# Patient Record
Sex: Female | Born: 1965 | Race: White | Hispanic: No | Smoking: Never smoker
Health system: Southern US, Community
[De-identification: ages and names within clinical notes are randomized; demographics above are authoritative.]

---

## 1987-02-18 HISTORY — PX: TONSILLECTOMY: SUR1361

## 1989-02-17 HISTORY — PX: BUNIONECTOMY: SHX129

## 1997-11-29 ENCOUNTER — Other Ambulatory Visit: Admission: RE | Admit: 1997-11-29 | Discharge: 1997-11-29 | Payer: Self-pay | Admitting: Obstetrics and Gynecology

## 1998-02-14 ENCOUNTER — Other Ambulatory Visit: Admission: RE | Admit: 1998-02-14 | Discharge: 1998-02-14 | Payer: Self-pay | Admitting: Obstetrics and Gynecology

## 1998-06-05 ENCOUNTER — Other Ambulatory Visit: Admission: RE | Admit: 1998-06-05 | Discharge: 1998-06-05 | Payer: Self-pay | Admitting: Obstetrics and Gynecology

## 1999-01-18 ENCOUNTER — Other Ambulatory Visit: Admission: RE | Admit: 1999-01-18 | Discharge: 1999-01-18 | Payer: Self-pay | Admitting: Obstetrics and Gynecology

## 1999-08-17 ENCOUNTER — Inpatient Hospital Stay (HOSPITAL_COMMUNITY): Admission: AD | Admit: 1999-08-17 | Discharge: 1999-08-20 | Payer: Self-pay | Admitting: Obstetrics and Gynecology

## 1999-08-17 ENCOUNTER — Encounter: Payer: Self-pay | Admitting: Obstetrics and Gynecology

## 1999-08-21 ENCOUNTER — Encounter: Admission: RE | Admit: 1999-08-21 | Discharge: 1999-09-17 | Payer: Self-pay | Admitting: Obstetrics and Gynecology

## 1999-09-26 ENCOUNTER — Other Ambulatory Visit: Admission: RE | Admit: 1999-09-26 | Discharge: 1999-09-26 | Payer: Self-pay | Admitting: Obstetrics and Gynecology

## 2000-11-11 ENCOUNTER — Other Ambulatory Visit: Admission: RE | Admit: 2000-11-11 | Discharge: 2000-11-11 | Payer: Self-pay | Admitting: Obstetrics and Gynecology

## 2001-12-16 ENCOUNTER — Other Ambulatory Visit: Admission: RE | Admit: 2001-12-16 | Discharge: 2001-12-16 | Payer: Self-pay | Admitting: Obstetrics and Gynecology

## 2003-03-08 ENCOUNTER — Other Ambulatory Visit: Admission: RE | Admit: 2003-03-08 | Discharge: 2003-03-08 | Payer: Self-pay | Admitting: Gynecology

## 2010-03-10 ENCOUNTER — Encounter: Payer: Self-pay | Admitting: Obstetrics and Gynecology

## 2012-06-23 ENCOUNTER — Other Ambulatory Visit: Payer: Self-pay | Admitting: Obstetrics and Gynecology

## 2012-06-23 DIAGNOSIS — N6311 Unspecified lump in the right breast, upper outer quadrant: Secondary | ICD-10-CM

## 2012-07-07 ENCOUNTER — Ambulatory Visit
Admission: RE | Admit: 2012-07-07 | Discharge: 2012-07-07 | Disposition: A | Discharge status: Home / Self Care | Payer: BC Managed Care – PPO | Source: Ambulatory Visit | Attending: Obstetrics and Gynecology | Admitting: Obstetrics and Gynecology

## 2012-07-07 DIAGNOSIS — N6311 Unspecified lump in the right breast, upper outer quadrant: Secondary | ICD-10-CM

## 2013-08-19 DIAGNOSIS — R61 Generalized hyperhidrosis: Secondary | ICD-10-CM | POA: Insufficient documentation

## 2013-08-19 DIAGNOSIS — J069 Acute upper respiratory infection, unspecified: Secondary | ICD-10-CM | POA: Insufficient documentation

## 2013-08-19 DIAGNOSIS — F4010 Social phobia, unspecified: Secondary | ICD-10-CM | POA: Insufficient documentation

## 2014-02-17 HISTORY — PX: BREAST ENHANCEMENT SURGERY: SHX7

## 2014-04-10 DIAGNOSIS — M25551 Pain in right hip: Secondary | ICD-10-CM | POA: Insufficient documentation

## 2015-05-02 DIAGNOSIS — F418 Other specified anxiety disorders: Secondary | ICD-10-CM | POA: Insufficient documentation

## 2015-06-08 ENCOUNTER — Other Ambulatory Visit: Payer: Self-pay | Admitting: Obstetrics and Gynecology

## 2015-06-08 DIAGNOSIS — R928 Other abnormal and inconclusive findings on diagnostic imaging of breast: Secondary | ICD-10-CM

## 2015-06-15 ENCOUNTER — Ambulatory Visit
Admission: RE | Admit: 2015-06-15 | Discharge: 2015-06-15 | Disposition: A | Discharge status: Home / Self Care | Payer: BC Managed Care – PPO | Source: Ambulatory Visit | Attending: Obstetrics and Gynecology | Admitting: Obstetrics and Gynecology

## 2015-06-15 DIAGNOSIS — R928 Other abnormal and inconclusive findings on diagnostic imaging of breast: Secondary | ICD-10-CM

## 2015-11-20 ENCOUNTER — Other Ambulatory Visit: Payer: Self-pay | Admitting: Obstetrics and Gynecology

## 2015-11-20 DIAGNOSIS — R928 Other abnormal and inconclusive findings on diagnostic imaging of breast: Secondary | ICD-10-CM

## 2015-12-04 ENCOUNTER — Other Ambulatory Visit: Payer: BC Managed Care – PPO

## 2015-12-05 ENCOUNTER — Other Ambulatory Visit: Payer: Self-pay | Admitting: Obstetrics and Gynecology

## 2015-12-05 ENCOUNTER — Ambulatory Visit
Admission: RE | Admit: 2015-12-05 | Discharge: 2015-12-05 | Disposition: A | Discharge status: Home / Self Care | Payer: BC Managed Care – PPO | Source: Ambulatory Visit | Attending: Obstetrics and Gynecology | Admitting: Obstetrics and Gynecology

## 2015-12-05 DIAGNOSIS — N63 Unspecified lump in unspecified breast: Secondary | ICD-10-CM

## 2015-12-05 DIAGNOSIS — R928 Other abnormal and inconclusive findings on diagnostic imaging of breast: Secondary | ICD-10-CM

## 2015-12-10 ENCOUNTER — Other Ambulatory Visit: Payer: Self-pay | Admitting: Obstetrics and Gynecology

## 2015-12-10 DIAGNOSIS — N6320 Unspecified lump in the left breast, unspecified quadrant: Secondary | ICD-10-CM

## 2015-12-17 ENCOUNTER — Ambulatory Visit
Admission: RE | Admit: 2015-12-17 | Discharge: 2015-12-17 | Disposition: A | Discharge status: Home / Self Care | Payer: BC Managed Care – PPO | Source: Ambulatory Visit | Attending: Obstetrics and Gynecology | Admitting: Obstetrics and Gynecology

## 2015-12-17 DIAGNOSIS — N6320 Unspecified lump in the left breast, unspecified quadrant: Secondary | ICD-10-CM

## 2017-03-18 ENCOUNTER — Encounter: Payer: Self-pay | Admitting: Internal Medicine

## 2017-05-06 ENCOUNTER — Encounter: Payer: Self-pay | Admitting: Internal Medicine

## 2017-05-06 ENCOUNTER — Ambulatory Visit (AMBULATORY_SURGERY_CENTER): Payer: Self-pay | Admitting: *Deleted

## 2017-05-06 ENCOUNTER — Other Ambulatory Visit: Payer: Self-pay

## 2017-05-06 VITALS — Ht 70.0 in | Wt 176.0 lb

## 2017-05-06 DIAGNOSIS — Z1211 Encounter for screening for malignant neoplasm of colon: Secondary | ICD-10-CM

## 2017-05-06 NOTE — Progress Notes (Signed)
Patient denies any allergies to eggs or soy. Patient denies any problems with anesthesia/sedation. Patient had nausea after breast augmentation sx 2016. Patient denies any oxygen use at home. Patient denies taking any diet/weight loss medications or blood thinners. EMMI education assisgned to patient on colonoscopy, this was explained and instructions given to patient.

## 2017-05-20 ENCOUNTER — Encounter: Payer: Self-pay | Admitting: Internal Medicine

## 2017-05-20 ENCOUNTER — Other Ambulatory Visit: Payer: Self-pay

## 2017-05-20 ENCOUNTER — Ambulatory Visit (AMBULATORY_SURGERY_CENTER): Payer: BC Managed Care – PPO | Admitting: Internal Medicine

## 2017-05-20 VITALS — BP 111/64 | HR 68 | Temp 98.9°F | Resp 15 | Ht 70.0 in | Wt 176.0 lb

## 2017-05-20 DIAGNOSIS — Z1211 Encounter for screening for malignant neoplasm of colon: Secondary | ICD-10-CM | POA: Diagnosis not present

## 2017-05-20 DIAGNOSIS — Z1212 Encounter for screening for malignant neoplasm of rectum: Secondary | ICD-10-CM

## 2017-05-20 MED ORDER — SODIUM CHLORIDE 0.9 % IV SOLN: 500.0000 mL | Freq: Once | INTRAVENOUS | Status: DC

## 2017-05-20 NOTE — Op Note (Signed)
[facility] Patient Name: Myracle Febres Procedure Date: 05/20/2017 12:04 PM MRN: [id number] Endoscopist: Iva Boop , MD Age: 52 Referring MD:  Date of Birth: 12-08-1965 Gender: Female Account #: [id number] Procedure:                Colonoscopy Indications:              Screening for colorectal malignant neoplasm, This                            is the patient's first colonoscopy Medicines:                Propofol per Anesthesia, Monitored Anesthesia Care Procedure:                Pre-Anesthesia Assessment:                           - Prior to the procedure, a History and Physical                            was performed, and patient medications and                            allergies were reviewed. The patient's tolerance of                            previous anesthesia was also reviewed. The risks                            and benefits of the procedure and the sedation                            options and risks were discussed with the patient.                            All questions were answered, and informed consent                            was obtained. Prior Anticoagulants: The patient has                            taken no previous anticoagulant or antiplatelet                            agents. ASA Grade Assessment: I - A normal, healthy                            patient. After reviewing the risks and benefits,                            the patient was deemed in satisfactory condition to                            undergo the procedure.  After obtaining informed consent, the colonoscope                            was passed under direct vision. Throughout the                            procedure, the patient's blood pressure, pulse, and                            oxygen saturations were monitored continuously. The                            Colonoscope was introduced through the anus and                            advanced to  the the cecum, identified by                            appendiceal orifice and ileocecal valve. The                            colonoscopy was technically difficult and complex                            due to a redundant colon. Successful completion of                            the procedure was aided by applying abdominal                            pressure. The patient tolerated the procedure well.                            The quality of the bowel preparation was excellent.                            The ileocecal valve, appendiceal orifice, and                            rectum were photographed. The bowel preparation                            used was Miralax. Scope In: 12:12:18 PM Scope Out: 12:31:41 PM Scope Withdrawal Time: 0 hours 10 minutes 2 seconds  Total Procedure Duration: 0 hours 19 minutes 23 seconds  Findings:                 The perianal and digital rectal examinations were                            normal.                           The entire examined colon appeared normal on direct  and retroflexion views. Complications:            No immediate complications. Estimated Blood Loss:     Estimated blood loss: none. Impression:               - The entire examined colon is normal on direct and                            retroflexion views.                           - No specimens collected. Recommendation:           - Patient has a contact number available for                            emergencies. The signs and symptoms of potential                            delayed complications were discussed with the                            patient. Return to normal activities tomorrow.                            Written discharge instructions were provided to the                            patient.                           - Resume previous diet.                           - Continue present medications.                           - Repeat  colonoscopy or other appropriate test not                            before but in 10 years. Iva Boop, MD 05/20/2017 12:35:48 PM This report has been signed electronically.

## 2017-05-20 NOTE — Patient Instructions (Addendum)
   Your colon is normal - no polyps or cancer seen!  Next routine colonoscopy or other screening test in 10 years - 2029  I appreciate the opportunity to care for you. Iva Booparl E. Devra Stare, MD, FACG YOU HAD AN ENDOSCOPIC PROCEDURE TODAY AT THE [facility]:   Refer to the procedure report that was given to you for any specific questions about what was found during the examination.  If the procedure report does not answer your questions, please call your gastroenterologist to clarify.  If you requested that your care partner not be given the details of your procedure findings, then the procedure report has been included in a sealed envelope for you to review at your convenience later.  YOU SHOULD EXPECT: Some feelings of bloating in the abdomen. Passage of more gas than usual.  Walking can help get rid of the air that was put into your GI tract during the procedure and reduce the bloating. If you had a lower endoscopy (such as a colonoscopy or flexible sigmoidoscopy) you may notice spotting of blood in your stool or on the toilet paper. If you underwent a bowel prep for your procedure, you may not have a normal bowel movement for a few days.  Please Note:  You might notice some irritation and congestion in your nose or some drainage.  This is from the oxygen used during your procedure.  There is no need for concern and it should clear up in a day or so.  SYMPTOMS TO REPORT IMMEDIATELY:   Following lower endoscopy (colonoscopy or flexible sigmoidoscopy):  Excessive amounts of blood in the stool  Significant tenderness or worsening of abdominal pains  Swelling of the abdomen that is new, acute  Fever of 100F or higher  For urgent or emergent issues, a gastroenterologist can be reached at any hour by calling [phone].   DIET:  We do recommend a small meal at first, but then you may proceed to your regular diet.  Drink plenty of fluids but you should avoid alcoholic beverages  for 24 hours.  ACTIVITY:  You should plan to take it easy for the rest of today and you should NOT DRIVE or use heavy machinery until tomorrow (because of the sedation medicines used during the test).    FOLLOW UP: Our staff will call the number listed on your records the next business day following your procedure to check on you and address any questions or concerns that you may have regarding the information given to you following your procedure. If we do not reach you, we will leave a message.  However, if you are feeling well and you are not experiencing any problems, there is no need to return our call.  We will assume that you have returned to your regular daily activities without incident.  If any biopsies were taken you will be contacted by phone or by letter within the next 1-3 weeks.  Please call us at [phone] if you have not heard about the biopsies in 3 weeks.   Repeat Colonoscopy screening in 10 years  SIGNATURES/CONFIDENTIALITY: You and/or your care partner have signed paperwork which will be entered into your electronic medical record.  These signatures attest to the fact that that the information above on your After Visit Summary has been reviewed and is understood.  Full responsibility of the confidentiality of this discharge information lies with you and/or your care-partner.

## 2017-05-20 NOTE — Progress Notes (Signed)
To recovery, report to RN, VSS. 

## 2017-05-21 ENCOUNTER — Telehealth: Payer: Self-pay

## 2017-05-21 NOTE — Telephone Encounter (Signed)
  Follow up Call-  Call back number 05/20/2017  Post procedure Call Back phone  # [phone]  Permission to leave phone message Yes  Some recent data might be hidden     Patient questions:  Do you have a fever, pain , or abdominal swelling? No. Pain Score  0 *  Have you tolerated food without any problems? Yes.    Have you been able to return to your normal activities? Yes.    Do you have any questions about your discharge instructions: Diet   No. Medications  No. Follow up visit  No.  Do you have questions or concerns about your Care? No.  Actions: * If pain score is 4 or above: No action needed, pain <4.

## 2017-09-11 ENCOUNTER — Other Ambulatory Visit: Payer: Self-pay | Admitting: Obstetrics and Gynecology

## 2017-09-11 DIAGNOSIS — N6019 Diffuse cystic mastopathy of unspecified breast: Secondary | ICD-10-CM

## 2017-09-25 ENCOUNTER — Ambulatory Visit
Admission: RE | Admit: 2017-09-25 | Discharge: 2017-09-25 | Disposition: A | Discharge status: Home / Self Care | Payer: BC Managed Care – PPO | Source: Ambulatory Visit | Attending: Obstetrics and Gynecology | Admitting: Obstetrics and Gynecology

## 2017-09-25 DIAGNOSIS — N6019 Diffuse cystic mastopathy of unspecified breast: Secondary | ICD-10-CM

## 2019-01-16 ENCOUNTER — Emergency Department (HOSPITAL_BASED_OUTPATIENT_CLINIC_OR_DEPARTMENT_OTHER): Payer: BC Managed Care – PPO

## 2019-01-16 ENCOUNTER — Encounter (HOSPITAL_BASED_OUTPATIENT_CLINIC_OR_DEPARTMENT_OTHER): Payer: Self-pay | Admitting: Emergency Medicine

## 2019-01-16 ENCOUNTER — Observation Stay (HOSPITAL_BASED_OUTPATIENT_CLINIC_OR_DEPARTMENT_OTHER)
Admission: EM | Admit: 2019-01-16 | Discharge: 2019-01-18 | Disposition: A | Discharge status: Home / Self Care | Payer: BC Managed Care – PPO | Attending: Surgery | Admitting: Surgery

## 2019-01-16 ENCOUNTER — Other Ambulatory Visit: Payer: Self-pay

## 2019-01-16 DIAGNOSIS — Z9882 Breast implant status: Secondary | ICD-10-CM | POA: Diagnosis not present

## 2019-01-16 DIAGNOSIS — Z79899 Other long term (current) drug therapy: Secondary | ICD-10-CM | POA: Insufficient documentation

## 2019-01-16 DIAGNOSIS — Z20828 Contact with and (suspected) exposure to other viral communicable diseases: Secondary | ICD-10-CM | POA: Diagnosis not present

## 2019-01-16 DIAGNOSIS — F419 Anxiety disorder, unspecified: Secondary | ICD-10-CM | POA: Insufficient documentation

## 2019-01-16 DIAGNOSIS — K810 Acute cholecystitis: Secondary | ICD-10-CM | POA: Diagnosis present

## 2019-01-16 DIAGNOSIS — K8010 Calculus of gallbladder with chronic cholecystitis without obstruction: Secondary | ICD-10-CM | POA: Diagnosis present

## 2019-01-16 DIAGNOSIS — Z419 Encounter for procedure for purposes other than remedying health state, unspecified: Secondary | ICD-10-CM

## 2019-01-16 DIAGNOSIS — K8000 Calculus of gallbladder with acute cholecystitis without obstruction: Secondary | ICD-10-CM | POA: Diagnosis not present

## 2019-01-16 DIAGNOSIS — R1011 Right upper quadrant pain: Secondary | ICD-10-CM | POA: Diagnosis present

## 2019-01-16 DIAGNOSIS — K819 Cholecystitis, unspecified: Secondary | ICD-10-CM

## 2019-01-16 LAB — COMPREHENSIVE METABOLIC PANEL
ALT: 14 U/L (ref 0–44)
AST: 19 U/L (ref 15–41)
Albumin: 4.1 g/dL (ref 3.5–5.0)
Alkaline Phosphatase: 47 U/L (ref 38–126)
Anion gap: 10 (ref 5–15)
BUN: 25 mg/dL — ABNORMAL HIGH (ref 6–20)
CO2: 28 mmol/L (ref 22–32)
Calcium: 8.9 mg/dL (ref 8.9–10.3)
Chloride: 101 mmol/L (ref 98–111)
Creatinine, Ser: 0.65 mg/dL (ref 0.44–1.00)
GFR calc Af Amer: >60 mL/min (ref >60)
GFR calc non Af Amer: >60 mL/min (ref >60)
Glucose, Bld: 137 mg/dL — ABNORMAL HIGH (ref 70–99)
Potassium: 3.7 mmol/L (ref 3.5–5.1)
Sodium: 139 mmol/L (ref 135–145)
Total Bilirubin: 0.5 mg/dL (ref 0.3–1.2)
Total Protein: 7.2 g/dL (ref 6.5–8.1)

## 2019-01-16 LAB — LIPASE, BLOOD: Lipase: 29 U/L (ref 11–51)

## 2019-01-16 LAB — CBC WITH DIFFERENTIAL/PLATELET
Abs Immature Granulocytes: 0.02 10*3/uL (ref 0.00–0.07)
Basophils Absolute: 0 10*3/uL (ref 0.0–0.1)
Basophils Relative: 0 %
Eosinophils Absolute: 0 10*3/uL (ref 0.0–0.5)
Eosinophils Relative: 0 %
HCT: 39.5 % (ref 36.0–46.0)
Hemoglobin: 12.8 g/dL (ref 12.0–15.0)
Immature Granulocytes: 0 %
Lymphocytes Relative: 8 %
Lymphs Abs: 0.7 10*3/uL (ref 0.7–4.0)
MCH: 30.1 pg (ref 26.0–34.0)
MCHC: 32.4 g/dL (ref 30.0–36.0)
MCV: 92.9 fL (ref 80.0–100.0)
Monocytes Absolute: 0.2 10*3/uL (ref 0.1–1.0)
Monocytes Relative: 3 %
Neutro Abs: 7.8 10*3/uL — ABNORMAL HIGH (ref 1.7–7.7)
Neutrophils Relative %: 89 %
Platelets: 243 10*3/uL (ref 150–400)
RBC: 4.25 MIL/uL (ref 3.87–5.11)
RDW: 12.4 % (ref 11.5–15.5)
WBC: 8.8 10*3/uL (ref 4.0–10.5)
nRBC: 0 % (ref 0.0–0.2)

## 2019-01-16 LAB — HIV ANTIBODY (ROUTINE TESTING W REFLEX): HIV Screen 4th Generation wRfx: NONREACTIVE

## 2019-01-16 LAB — URINALYSIS, ROUTINE W REFLEX MICROSCOPIC
Bilirubin Urine: NEGATIVE
Glucose, UA: NEGATIVE mg/dL
Hgb urine dipstick: NEGATIVE
Ketones, ur: NEGATIVE mg/dL
Leukocytes,Ua: NEGATIVE
Nitrite: NEGATIVE
Protein, ur: NEGATIVE mg/dL
Specific Gravity, Urine: 1.015 (ref 1.005–1.030)
pH: >9 — ABNORMAL HIGH (ref 5.0–8.0)

## 2019-01-16 LAB — PROTIME-INR
INR: 0.9 (ref 0.8–1.2)
Prothrombin Time: 12.1 seconds (ref 11.4–15.2)

## 2019-01-16 LAB — SARS CORONAVIRUS 2 (TAT 6-24 HRS): SARS Coronavirus 2: NEGATIVE

## 2019-01-16 MED ORDER — ONDANSETRON HCL 4 MG/2ML IJ SOLN
4.0000 mg | Freq: Once | INTRAMUSCULAR | Status: AC
  Administered 2019-01-16: 4 mg via INTRAVENOUS
  Filled 2019-01-16: qty 2

## 2019-01-16 MED ORDER — HYDROMORPHONE HCL 1 MG/ML IJ SOLN
0.5000 mg | INTRAMUSCULAR | Status: DC | PRN
  Administered 2019-01-16: 0.5 mg via INTRAVENOUS
  Filled 2019-01-16: qty 1

## 2019-01-16 MED ORDER — PROMETHAZINE HCL 25 MG/ML IJ SOLN
25.0000 mg | Freq: Once | INTRAMUSCULAR | Status: AC
  Administered 2019-01-16: 25 mg via INTRAVENOUS

## 2019-01-16 MED ORDER — ONDANSETRON 4 MG PO TBDP: 4.0000 mg | ORAL_TABLET | Freq: Four times a day (QID) | ORAL | Status: DC | PRN

## 2019-01-16 MED ORDER — ONDANSETRON 4 MG PO TBDP
ORAL_TABLET | ORAL | Status: AC
  Filled 2019-01-16: qty 1

## 2019-01-16 MED ORDER — ONDANSETRON HCL 4 MG/2ML IJ SOLN
4.0000 mg | Freq: Four times a day (QID) | INTRAMUSCULAR | Status: DC | PRN
  Administered 2019-01-17: 4 mg via INTRAVENOUS
  Filled 2019-01-16: qty 2

## 2019-01-16 MED ORDER — MORPHINE SULFATE (PF) 4 MG/ML IV SOLN
4.0000 mg | Freq: Once | INTRAVENOUS | Status: AC
  Administered 2019-01-16: 4 mg via INTRAVENOUS
  Filled 2019-01-16: qty 1

## 2019-01-16 MED ORDER — MORPHINE SULFATE (PF) 4 MG/ML IV SOLN
4.0000 mg | Freq: Once | INTRAVENOUS | Status: AC
  Administered 2019-01-16: 09:00:00 4 mg via INTRAVENOUS
  Filled 2019-01-16: qty 1

## 2019-01-16 MED ORDER — PANTOPRAZOLE SODIUM 40 MG IV SOLR
40.0000 mg | Freq: Every day | INTRAVENOUS | Status: DC
  Administered 2019-01-16 – 2019-01-17 (×2): 40 mg via INTRAVENOUS
  Filled 2019-01-16 (×2): qty 40

## 2019-01-16 MED ORDER — SODIUM CHLORIDE 0.9 % IV SOLN
2.0000 g | INTRAVENOUS | Status: DC
  Administered 2019-01-16 – 2019-01-17 (×2): 2 g via INTRAVENOUS
  Filled 2019-01-16: qty 2
  Filled 2019-01-16 (×2): qty 20

## 2019-01-16 MED ORDER — PROMETHAZINE HCL 25 MG/ML IJ SOLN
INTRAMUSCULAR | Status: AC
  Filled 2019-01-16: qty 1

## 2019-01-16 MED ORDER — SODIUM CHLORIDE 0.9 % IV SOLN
Freq: Once | INTRAVENOUS | Status: AC
  Administered 2019-01-16: 12:00:00 via INTRAVENOUS

## 2019-01-16 MED ORDER — MORPHINE SULFATE (PF) 2 MG/ML IV SOLN
1.0000 mg | INTRAVENOUS | Status: DC | PRN
  Administered 2019-01-16 – 2019-01-17 (×3): 1 mg via INTRAVENOUS
  Administered 2019-01-17 (×2): 2 mg via INTRAVENOUS
  Filled 2019-01-16 (×5): qty 1

## 2019-01-16 MED ORDER — KCL IN DEXTROSE-NACL 20-5-0.9 MEQ/L-%-% IV SOLN
INTRAVENOUS | Status: DC
  Administered 2019-01-16: 17:00:00 via INTRAVENOUS
  Filled 2019-01-16 (×3): qty 1000

## 2019-01-16 MED ORDER — PIPERACILLIN-TAZOBACTAM 3.375 G IVPB 30 MIN
3.3750 g | Freq: Once | INTRAVENOUS | Status: AC
  Administered 2019-01-16: 3.375 g via INTRAVENOUS
  Filled 2019-01-16 (×2): qty 50

## 2019-01-16 MED ORDER — IOHEXOL 300 MG/ML  SOLN
100.0000 mL | Freq: Once | INTRAMUSCULAR | Status: AC | PRN
  Administered 2019-01-16: 10:00:00 100 mL via INTRAVENOUS

## 2019-01-16 MED ORDER — SODIUM CHLORIDE 0.9 % IV BOLUS
1000.0000 mL | Freq: Once | INTRAVENOUS | Status: AC
  Administered 2019-01-16: 09:00:00 1000 mL via INTRAVENOUS

## 2019-01-16 NOTE — Progress Notes (Signed)
Pt received in stable condition from [place] via [place] to room 1532.  Dr. Marlou Starks paged and made aware. RN will monitor.

## 2019-01-16 NOTE — Plan of Care (Signed)

## 2019-01-16 NOTE — ED Notes (Signed)
Report given to [facility] 

## 2019-01-16 NOTE — ED Provider Notes (Addendum)
[facility] EMERGENCY DEPARTMENT Provider Note   CSN: [id number] Arrival date & time: 01/16/19  0827     History   Chief Complaint Chief Complaint  Patient presents with  . Abdominal Pain  . Nausea  . Diarrhea    HPI Charlotte Hudson is a 53 y.o. female.  She is complaining of acute onset of epigastric abdominal pain nausea vomiting and diarrhea that occurred around 2 AM this morning.  No blood from above or below.  No fevers but has been feeling hot and cold.  No cough or shortness of breath.  No urinary symptoms.  No sick contacts or recent travel.     The history is provided by the patient.  Abdominal Pain Pain location:  Epigastric Pain quality: cramping   Pain radiates to:  Does not radiate Pain severity:  Severe Onset quality:  Sudden Duration:  7 hours Timing:  Constant Progression:  Waxing and waning Chronicity:  New Context: awakening from sleep   Context: not recent illness, not recent travel and not trauma   Relieved by:  Nothing Worsened by:  Nothing Ineffective treatments:  OTC medications Associated symptoms: diarrhea, nausea and vomiting   Associated symptoms: no chest pain, no constipation, no cough, no dysuria, no fever, no hematemesis, no hematochezia, no hematuria, no melena, no shortness of breath, no sore throat and no vaginal bleeding     History reviewed. No pertinent past medical history.  Patient Active Problem List   Diagnosis Date Noted  . Performance anxiety 05/02/2015  . Right hip pain 04/10/2014  . Acute upper respiratory infection 08/19/2013  . Excessive sweating 08/19/2013  . Social phobia 08/19/2013    Past Surgical History:  Procedure Laterality Date  . BREAST ENHANCEMENT SURGERY  2016  . BUNIONECTOMY  1991  . TONSILLECTOMY  1989     OB History   No obstetric history on file.      Home Medications    Prior to Admission medications   Medication Sig Start Date End Date Taking? Authorizing Provider  Norethin  Ace-Eth Estrad-FE (TAYTULLA) 1-20 MG-MCG(24) CAPS Take 1 tablet by mouth daily. 01/07/17   [provider]    Family History Family History  Problem Relation Age of Onset  . Heart failure Mother   . Colon cancer Neg Hx   . Esophageal cancer Neg Hx   . Rectal cancer Neg Hx   . Stomach cancer Neg Hx     Social History Social History   Tobacco Use  . Smoking status: Never Smoker  . Smokeless tobacco: Never Used  Substance Use Topics  . Alcohol use: Yes    Alcohol/week: 2.0 standard drinks    Types: 2 Glasses of wine per week  . Drug use: No     Allergies   Sulfa antibiotics   Review of Systems Review of Systems  Constitutional: Negative for fever.  HENT: Negative for sore throat.   Eyes: Negative for visual disturbance.  Respiratory: Negative for cough and shortness of breath.   Cardiovascular: Negative for chest pain.  Gastrointestinal: Positive for diarrhea, nausea and vomiting. Negative for abdominal pain, constipation, hematemesis, hematochezia and melena.  Genitourinary: Negative for dysuria, hematuria and vaginal bleeding.  Musculoskeletal: Negative for back pain.  Skin: Negative for rash.  Neurological: Negative for headaches.     Physical Exam Updated Vital Signs BP (!) 111/51   Pulse 63   Temp 97.7 F (36.5 C) (Oral)   Resp 18   SpO2 100%   Physical Exam  Vitals signs and nursing note reviewed.  Constitutional:      General: She is not in acute distress.    Appearance: She is well-developed.  HENT:     Head: Normocephalic and atraumatic.  Eyes:     Conjunctiva/sclera: Conjunctivae normal.  Neck:     Musculoskeletal: Neck supple.  Cardiovascular:     Rate and Rhythm: Normal rate and regular rhythm.     Pulses: Normal pulses.     Heart sounds: No murmur.  Pulmonary:     Effort: Pulmonary effort is normal. No respiratory distress.     Breath sounds: Normal breath sounds.  Abdominal:     General: Abdomen is flat.     Palpations:  Abdomen is soft.     Tenderness: There is generalized abdominal tenderness and tenderness in the right upper quadrant, epigastric area, periumbilical area and left upper quadrant. There is no guarding or rebound.  Musculoskeletal: Normal range of motion.     Right lower leg: No edema.     Left lower leg: No edema.  Skin:    General: Skin is warm and dry.     Capillary Refill: Capillary refill takes less than 2 seconds.  Neurological:     General: No focal deficit present.     Mental Status: She is alert.      ED Treatments / Results  Labs (all labs ordered are listed, but only abnormal results are displayed) Labs Reviewed  CBC WITH DIFFERENTIAL/PLATELET - Abnormal; Notable for the following components:      Result Value   Neutro Abs 7.8 (*)    All other components within normal limits  COMPREHENSIVE METABOLIC PANEL - Abnormal; Notable for the following components:   Glucose, Bld 137 (*)    BUN 25 (*)    All other components within normal limits  URINALYSIS, ROUTINE W REFLEX MICROSCOPIC - Abnormal; Notable for the following components:   APPearance CLOUDY (*)    pH >9.0 (*)    All other components within normal limits  SARS CORONAVIRUS 2 (TAT 6-24 HRS)  LIPASE, BLOOD  PROTIME-INR  HIV ANTIBODY (ROUTINE TESTING W REFLEX)  COMPREHENSIVE METABOLIC PANEL  CBC    EKG None  Radiology Ct Abdomen Pelvis W Contrast  Result Date: 01/16/2019 CLINICAL DATA:  Abdominal pain with nausea and vomiting EXAM: CT ABDOMEN AND PELVIS WITH CONTRAST TECHNIQUE: Multidetector CT imaging of the abdomen and pelvis was performed using the standard protocol following bolus administration of intravenous contrast. CONTRAST:  100mL OMNIPAQUE IOHEXOL 300 MG/ML  SOLN COMPARISON:  None. FINDINGS: Lower chest: Lung bases are clear. Breast implants present bilaterally. Hepatobiliary: No focal liver lesions are appreciable. There is apparent sludge in the gallbladder with questionable tiny gallstones.  Gallbladder wall appears mildly thickened with questionable mild pericholecystic fluid. There is slight intrahepatic biliary duct dilatation. There is no common bile duct dilatation. No biliary duct mass or calculus evident. Pancreas: There is no pancreatic mass or inflammatory focus. Spleen: No splenic lesions are evident. Tiny accessory spleen noted inferior to the spleen. Adrenals/Urinary Tract: Adrenals bilaterally appear normal. There is a 5 mm probable cyst in the lower pole left kidney. There is no evident hydronephrosis on either side. There is no evident renal or ureteral calculus on either side. Urinary bladder is midline with wall thickness within normal limits. Stomach/Bowel: There is no appreciable bowel wall or mesenteric thickening. There is moderate stool in the right colon. There is no appreciable bowel obstruction. Terminal ileum appears unremarkable. There is no  evident free air or portal venous air. Vascular/Lymphatic: There is no abdominal aortic aneurysm. No vascular lesions are evident on this study. There is no appreciable adenopathy in the abdomen or pelvis. Reproductive: The uterus is anteverted.  No evident pelvic mass. Other: Appendix appears normal. No abscess or ascites is evident in the abdomen or pelvis. Musculoskeletal: There is degenerative change at L5-S1 with vacuum phenomenon at this level. No blastic or lytic bone lesions are evident. No intramuscular or abdominal wall lesions are evident. IMPRESSION: 1. There is apparent thickening of the gallbladder wall with questionable pericholecystic fluid. There is sludge with questionable superimposed gallstones in the gallbladder. Concern for a degree of cholecystitis; advise correlation with gallbladder ultrasound. 2. Slight intrahepatic biliary duct dilatation. No common bile duct dilatation. 3. No bowel obstruction. No abscess in the abdomen or pelvis. Appendix appears normal. 4. No evident renal or ureteral calculus. No  hydronephrosis. Urinary bladder wall thickness is within normal limits. Electronically Signed   By: Lowella Grip III M.D.   On: 01/16/2019 10:33   US Abdomen Limited Ruq  Result Date: 01/16/2019 CLINICAL DATA:  Epigastric pain. Nausea, vomiting, diarrhea today. Abnormal CT. EXAM: ULTRASOUND ABDOMEN LIMITED RIGHT UPPER QUADRANT COMPARISON:  None. FINDINGS: Gallbladder: Multiple gallstones, largest measuring approximately 2 cm greatest dimension. Additional gallbladder sludge. Gallbladder wall thickening with a demonstrated measurement of 5 mm. Sonographer describes a probable obstructing stone at the gallbladder neck, but difficult to demonstrate. Positive sonographic Murphy's sign was elicited during the exam. Common bile duct: Diameter: 3 mm Liver: No focal lesion identified. Within normal limits in parenchymal echogenicity. Portal vein is patent on color Doppler imaging with normal direction of blood flow towards the liver. Other: None. IMPRESSION: 1. Findings are consistent with acute cholecystitis. Multiple gallstones, with a probable obstructing stone at the gallbladder neck. 2. No bile duct dilatation. 3. Liver appears normal. Electronically Signed   By: Franki Cabot M.D.   On: 01/16/2019 11:13    Procedures Procedures (including critical care time)  Medications Ordered in ED Medications  dextrose 5 % and 0.9 % NaCl with KCl 20 mEq/L infusion (has no administration in time range)  cefTRIAXone (ROCEPHIN) 2 g in sodium chloride 0.9 % 100 mL IVPB (2 g Intravenous New Bag/Given 01/16/19 1555)  morphine 2 MG/ML injection 1-4 mg (1 mg Intravenous Given 01/16/19 1551)  ondansetron (ZOFRAN-ODT) disintegrating tablet 4 mg (has no administration in time range)    Or  ondansetron (ZOFRAN) injection 4 mg (has no administration in time range)  pantoprazole (PROTONIX) injection 40 mg (has no administration in time range)  morphine 4 MG/ML injection 4 mg (4 mg Intravenous Given 01/16/19 0854)   ondansetron (ZOFRAN) injection 4 mg (4 mg Intravenous Given 01/16/19 0852)  sodium chloride 0.9 % bolus 1,000 mL (0 mLs Intravenous Stopped 01/16/19 1043)  iohexol (OMNIPAQUE) 300 MG/ML solution 100 mL (100 mLs Intravenous Contrast Given 01/16/19 1009)  morphine 4 MG/ML injection 4 mg (4 mg Intravenous Given 01/16/19 1040)  promethazine (PHENERGAN) injection 25 mg (25 mg Intravenous Given 01/16/19 1040)  piperacillin-tazobactam (ZOSYN) IVPB 3.375 g (0 g Intravenous Stopped 01/16/19 1218)  0.9 %  sodium chloride infusion ( Intravenous New Bag/Given 01/16/19 1225)     Initial Impression / Assessment and Plan / ED Course  I have reviewed the triage vital signs and the nursing notes.  Pertinent labs & imaging results that were available during my care of the patient were reviewed by me and considered in my medical decision making (see  chart for details).  Clinical Course as of Jan 15 1714  Wynelle Link Jan 16, 2019  [id number] 53 year old female with acute onset of nausea vomiting diarrhea and diffuse upper abdominal pain.  Differential includes obstruction, cholelithiasis, cholecystitis, a GE, peptic ulcer disease, gastritis diverticulitis.    [MB]  1027 Patient CT interpreted by me as gallbladder wall thickening pericholecystic fluid and gallstones.  Awaiting radiology reading.   [MB]  1117 Right upper quadrant ultrasound shows probable acute cholecystitis.  We will put a consult into general surgery.   [MB]  1133 Discussed with Dr. Carolynne Edouard from general surgery.  He is recommending patient be direct admitted to [facility] long where he will evaluate patient.  I updated the patient with this and she is comfortable with plan.   [MB]    Clinical Course User Index [MB] Terrilee Files, MD        Final Clinical Impressions(s) / ED Diagnoses   Final diagnoses:  RUQ abdominal pain  Cholecystitis    ED Discharge Orders    None       Terrilee Files, MD 01/16/19 1716    Terrilee Files, MD  01/16/19 [phone]

## 2019-01-16 NOTE — H&P (Signed)
Charlotte Hudson is an 53 y.o. female.   Chief Complaint: abd pain HPI:  The patient is a 53 year old white female who presents with RUQ abd pain that started about 2 am. It was associated with nausea and vomiting and diarrhea. Denies fever or chills. She had a similar episode a couple months ago that went away quickly. She came to ER where CT and u/s show stones gallbladder and thickening of gb wall. lft's normal  History reviewed. No pertinent past medical history.  Past Surgical History:  Procedure Laterality Date  . BREAST ENHANCEMENT SURGERY  2016  . BUNIONECTOMY  1991  . TONSILLECTOMY  1989    Family History  Problem Relation Age of Onset  . Heart failure Mother   . Colon cancer Neg Hx   . Esophageal cancer Neg Hx   . Rectal cancer Neg Hx   . Stomach cancer Neg Hx    Social History:  reports that she has never smoked. She has never used smokeless tobacco. She reports current alcohol use of about 2.0 standard drinks of alcohol per week. She reports that she does not use drugs.  Allergies:  Allergies  Allergen Reactions  . Sulfa Antibiotics Nausea And Vomiting and Swelling    Facility-Administered Medications Prior to Admission  Medication Dose Route Frequency Provider Last Rate Last Dose  . 0.9 %  sodium chloride infusion  500 mL Intravenous Once Iva Boop, MD       Medications Prior to Admission  Medication Sig Dispense Refill  . Norethin Ace-Eth Estrad-FE (TAYTULLA) 1-20 MG-MCG(24) CAPS Take 1 tablet by mouth daily.      Results for orders placed or performed during the hospital encounter of 01/16/19 (from the past 48 hour(s))  Urinalysis, Routine w reflex microscopic     Status: Abnormal   Collection Time: 01/16/19  8:50 AM  Result Value Ref Range   Color, Urine YELLOW YELLOW   APPearance CLOUDY (A) CLEAR   Specific Gravity, Urine 1.015 1.005 - 1.030   pH >9.0 (H) 5.0 - 8.0   Glucose, UA NEGATIVE NEGATIVE mg/dL   Hgb urine dipstick NEGATIVE NEGATIVE   Bilirubin Urine NEGATIVE NEGATIVE   Ketones, ur NEGATIVE NEGATIVE mg/dL   Protein, ur NEGATIVE NEGATIVE mg/dL   Nitrite NEGATIVE NEGATIVE   Leukocytes,Ua NEGATIVE NEGATIVE    Comment: Microscopic not done on urines with negative protein, blood, leukocytes, nitrite, or glucose < 500 mg/dL. Performed at [facility], [address] [place], [place] [address]   CBC with Differential     Status: Abnormal   Collection Time: 01/16/19  8:51 AM  Result Value Ref Range   WBC 8.8 4.0 - 10.5 K/uL   RBC 4.25 3.87 - 5.11 MIL/uL   Hemoglobin 12.8 12.0 - 15.0 g/dL   HCT 40.9 81.1 - 91.4 %   MCV 92.9 80.0 - 100.0 fL   MCH 30.1 26.0 - 34.0 pg   MCHC 32.4 30.0 - 36.0 g/dL   RDW 78.2 95.6 - 21.3 %   Platelets 243 150 - 400 K/uL   nRBC 0.0 0.0 - 0.2 %   Neutrophils Relative % 89 %   Neutro Abs 7.8 (H) 1.7 - 7.7 K/uL   Lymphocytes Relative 8 %   Lymphs Abs 0.7 0.7 - 4.0 K/uL   Monocytes Relative 3 %   Monocytes Absolute 0.2 0.1 - 1.0 K/uL   Eosinophils Relative 0 %   Eosinophils Absolute 0.0 0.0 - 0.5 K/uL   Basophils  Relative 0 %   Basophils Absolute 0.0 0.0 - 0.1 K/uL   Immature Granulocytes 0 %   Abs Immature Granulocytes 0.02 0.00 - 0.07 K/uL    Comment: Performed at [facility] [place], [address] [place], [place] [address]  Comprehensive metabolic panel     Status: Abnormal   Collection Time: 01/16/19  8:51 AM  Result Value Ref Range   Sodium 139 135 - 145 mmol/L   Potassium 3.7 3.5 - 5.1 mmol/L   Chloride 101 98 - 111 mmol/L   CO2 28 22 - 32 mmol/L   Glucose, Bld 137 (H) 70 - 99 mg/dL   BUN 25 (H) 6 - 20 mg/dL   Creatinine, Ser 6.040.65 0.44 - 1.00 mg/dL   Calcium 8.9 8.9 - 54.010.3 mg/dL   Total Protein 7.2 6.5 - 8.1 g/dL   Albumin 4.1 3.5 - 5.0 g/dL   AST 19 15 - 41 U/L   ALT 14 0 - 44 U/L   Alkaline Phosphatase 47 38 - 126 U/L   Total Bilirubin 0.5 0.3 - 1.2 mg/dL   GFR calc non Af Amer >60 >60 mL/min   GFR calc Af Amer >60 >60 mL/min   Anion gap 10 5 - 15     Comment: Performed at [facility] [place], [address] [place], [place] [address]  Lipase, blood     Status: None   Collection Time: 01/16/19  8:51 AM  Result Value Ref Range   Lipase 29 11 - 51 U/L    Comment: Performed at [facility] [place], [address] [place], [place] [address]  Protime-INR     Status: None   Collection Time: 01/16/19  8:51 AM  Result Value Ref Range   Prothrombin Time 12.1 11.4 - 15.2 seconds   INR 0.9 0.8 - 1.2    Comment: (NOTE) INR goal varies based on device and disease states. Performed at [facility] [place], [address] [place], [place] [address]    Ct Abdomen Pelvis W Contrast  Result Date: 01/16/2019 CLINICAL DATA:  Abdominal pain with nausea and vomiting EXAM: CT ABDOMEN AND PELVIS WITH CONTRAST TECHNIQUE: Multidetector CT imaging of the abdomen and pelvis was performed using the standard protocol following bolus administration of intravenous contrast. CONTRAST:  100mL OMNIPAQUE IOHEXOL 300 MG/ML  SOLN COMPARISON:  None. FINDINGS: Lower chest: Lung bases are clear. Breast implants present bilaterally. Hepatobiliary: No focal liver lesions are appreciable. There is apparent sludge in the gallbladder with questionable tiny gallstones. Gallbladder wall appears mildly thickened with questionable mild pericholecystic fluid. There is slight intrahepatic biliary duct dilatation. There is no common bile duct dilatation. No biliary duct mass or calculus evident. Pancreas: There is no pancreatic mass or inflammatory focus. Spleen: No splenic lesions are evident. Tiny accessory spleen noted inferior to the spleen. Adrenals/Urinary Tract: Adrenals bilaterally appear normal. There is a 5 mm probable cyst in the lower pole left kidney. There is no evident hydronephrosis on either side. There is no evident renal or ureteral calculus on either side. Urinary bladder is midline with wall thickness within normal limits. Stomach/Bowel: There is no appreciable  bowel wall or mesenteric thickening. There is moderate stool in the right colon. There is no appreciable bowel obstruction. Terminal ileum appears unremarkable. There is no evident free air or portal venous air. Vascular/Lymphatic: There is no abdominal aortic aneurysm. No vascular lesions are evident on this study. There is no appreciable adenopathy in the  abdomen or pelvis. Reproductive: The uterus is anteverted.  No evident pelvic mass. Other: Appendix appears normal. No abscess or ascites is evident in the abdomen or pelvis. Musculoskeletal: There is degenerative change at L5-S1 with vacuum phenomenon at this level. No blastic or lytic bone lesions are evident. No intramuscular or abdominal wall lesions are evident. IMPRESSION: 1. There is apparent thickening of the gallbladder wall with questionable pericholecystic fluid. There is sludge with questionable superimposed gallstones in the gallbladder. Concern for a degree of cholecystitis; advise correlation with gallbladder ultrasound. 2. Slight intrahepatic biliary duct dilatation. No common bile duct dilatation. 3. No bowel obstruction. No abscess in the abdomen or pelvis. Appendix appears normal. 4. No evident renal or ureteral calculus. No hydronephrosis. Urinary bladder wall thickness is within normal limits. Electronically Signed   By: Bretta Bang III M.D.   On: 01/16/2019 10:33   US Abdomen Limited Ruq  Result Date: 01/16/2019 CLINICAL DATA:  Epigastric pain. Nausea, vomiting, diarrhea today. Abnormal CT. EXAM: ULTRASOUND ABDOMEN LIMITED RIGHT UPPER QUADRANT COMPARISON:  None. FINDINGS: Gallbladder: Multiple gallstones, largest measuring approximately 2 cm greatest dimension. Additional gallbladder sludge. Gallbladder wall thickening with a demonstrated measurement of 5 mm. Sonographer describes a probable obstructing stone at the gallbladder neck, but difficult to demonstrate. Positive sonographic Murphy's sign was elicited during the exam.  Common bile duct: Diameter: 3 mm Liver: No focal lesion identified. Within normal limits in parenchymal echogenicity. Portal vein is patent on color Doppler imaging with normal direction of blood flow towards the liver. Other: None. IMPRESSION: 1. Findings are consistent with acute cholecystitis. Multiple gallstones, with a probable obstructing stone at the gallbladder neck. 2. No bile duct dilatation. 3. Liver appears normal. Electronically Signed   By: Bary Richard M.D.   On: 01/16/2019 11:13    Review of Systems  Constitutional: Negative.   HENT: Negative.   Eyes: Negative.   Respiratory: Negative.   Cardiovascular: Negative.   Gastrointestinal: Positive for abdominal pain, diarrhea, nausea and vomiting.  Genitourinary: Negative.   Musculoskeletal: Negative.   Skin: Negative.   Neurological: Negative.   Endo/Heme/Allergies: Negative.   Psychiatric/Behavioral: Negative.     Blood pressure 115/74, pulse 65, temperature 98.4 F (36.9 C), temperature source Oral, resp. rate 14, height 5\' 10"  (1.778 m), weight 80.7 kg, last menstrual period 01/18/2018, SpO2 100 %. Physical Exam  Constitutional: She is oriented to person, place, and time. She appears well-developed and well-nourished. No distress.  HENT:  Head: Normocephalic and atraumatic.  Mouth/Throat: No oropharyngeal exudate.  Eyes: Pupils are equal, round, and reactive to light. Conjunctivae and EOM are normal.  Neck: Normal range of motion. Neck supple.  Cardiovascular: Normal rate, regular rhythm and normal heart sounds.  Respiratory: Effort normal and breath sounds normal. No stridor. No respiratory distress.  GI: Soft. Bowel sounds are normal. She exhibits no mass.  There is mild RUQ tenderness  Musculoskeletal: Normal range of motion.        General: No tenderness or edema.  Neurological: She is alert and oriented to person, place, and time. Coordination normal.  Skin: Skin is warm and dry. No rash noted.  Psychiatric: She  has a normal mood and affect. Her behavior is normal. Thought content normal.     Assessment/Plan The patient appears to have cholecystitis with cholelithiasis. Because of the risk of further painful episodes and possible pancreatitis I think she would benefit from having gb removed. Will plan to admit and cover with broad spectrum abx. covid test is pending.  Will discuss with team in am  Autumn Messing III, MD 01/16/2019, 5:53 PM

## 2019-01-16 NOTE — ED Notes (Signed)
ED TO INPATIENT HANDOFF REPORT  ED Nurse Name and Phone #: Earlie Counts [phone]  S Name/Age/Gender Charlotte Hudson 53 y.o. female Room/Bed: MH05/MH05  Code Status   Code Status: Not on file  Home/SNF/Other Home Patient oriented to: self, place, time and situation Is this baseline? Yes   Triage Complete: Triage complete  Chief Complaint Diarrhea, Nausea, Abd Pain  Triage Note Pt here with epigastric abdominal pain, N/V and diarrhea since this morning.    Allergies Allergies  Allergen Reactions  . Sulfa Antibiotics Nausea And Vomiting and Swelling    Level of Care/Admitting Diagnosis ED Disposition    ED Disposition Condition Comment   Admit  Hospital Area: [facility] [[id number]]  Level of Care: Med-Surg [16]  Covid Evaluation: Asymptomatic Screening Protocol (No Symptoms)  Diagnosis: Cholecystitis, acute [454098]  Admitting Physician: Jayme Cloud  Attending Physician: Griselda Miner [phone]  Estimated length of stay: past midnight tomorrow  Certification:: I certify this patient will need inpatient services for at least 2 midnights  PT Class (Do Not Modify): Inpatient [101]  PT Acc Code (Do Not Modify): Private [1]       B Medical/Surgery History History reviewed. No pertinent past medical history. Past Surgical History:  Procedure Laterality Date  . BREAST ENHANCEMENT SURGERY  2016  . BUNIONECTOMY  1991  . TONSILLECTOMY  1989     A IV Location/Drains/Wounds Patient Lines/Drains/Airways Status   Active Line/Drains/Airways    Name:   Placement date:   Placement time:   Site:   Days:   Peripheral IV 01/16/19 Right Antecubital   01/16/19    0851    Antecubital   less than 1          Intake/Output Last 24 hours  Intake/Output Summary (Last 24 hours) at 01/16/2019 1152 Last data filed at 01/16/2019 1043 Gross per 24 hour  Intake 1000 ml  Output -  Net 1000 ml    Labs/Imaging Results for orders placed or performed during  the hospital encounter of 01/16/19 (from the past 48 hour(s))  Urinalysis, Routine w reflex microscopic     Status: Abnormal   Collection Time: 01/16/19  8:50 AM  Result Value Ref Range   Color, Urine YELLOW YELLOW   APPearance CLOUDY (A) CLEAR   Specific Gravity, Urine 1.015 1.005 - 1.030   pH >9.0 (H) 5.0 - 8.0   Glucose, UA NEGATIVE NEGATIVE mg/dL   Hgb urine dipstick NEGATIVE NEGATIVE   Bilirubin Urine NEGATIVE NEGATIVE   Ketones, ur NEGATIVE NEGATIVE mg/dL   Protein, ur NEGATIVE NEGATIVE mg/dL   Nitrite NEGATIVE NEGATIVE   Leukocytes,Ua NEGATIVE NEGATIVE    Comment: Microscopic not done on urines with negative protein, blood, leukocytes, nitrite, or glucose < 500 mg/dL. Performed at [facility], [address] [place], [place] [address]   CBC with Differential     Status: Abnormal   Collection Time: 01/16/19  8:51 AM  Result Value Ref Range   WBC 8.8 4.0 - 10.5 K/uL   RBC 4.25 3.87 - 5.11 MIL/uL   Hemoglobin 12.8 12.0 - 15.0 g/dL   HCT 56.2 13.0 - 86.5 %   MCV 92.9 80.0 - 100.0 fL   MCH 30.1 26.0 - 34.0 pg   MCHC 32.4 30.0 - 36.0 g/dL   RDW 78.4 69.6 - 29.5 %   Platelets 243 150 - 400 K/uL   nRBC 0.0 0.0 - 0.2 %   Neutrophils Relative % 89 %  Neutro Abs 7.8 (H) 1.7 - 7.7 K/uL   Lymphocytes Relative 8 %   Lymphs Abs 0.7 0.7 - 4.0 K/uL   Monocytes Relative 3 %   Monocytes Absolute 0.2 0.1 - 1.0 K/uL   Eosinophils Relative 0 %   Eosinophils Absolute 0.0 0.0 - 0.5 K/uL   Basophils Relative 0 %   Basophils Absolute 0.0 0.0 - 0.1 K/uL   Immature Granulocytes 0 %   Abs Immature Granulocytes 0.02 0.00 - 0.07 K/uL    Comment: Performed at [facility], [address] [place], [place] [address]  Comprehensive metabolic panel     Status: Abnormal   Collection Time: 01/16/19  8:51 AM  Result Value Ref Range   Sodium 139 135 - 145 mmol/L   Potassium 3.7 3.5 - 5.1 mmol/L   Chloride 101 98 - 111 mmol/L   CO2 28 22 - 32 mmol/L   Glucose, Bld 137 (H)  70 - 99 mg/dL   BUN 25 (H) 6 - 20 mg/dL   Creatinine, Ser 8.24 0.44 - 1.00 mg/dL   Calcium 8.9 8.9 - 23.5 mg/dL   Total Protein 7.2 6.5 - 8.1 g/dL   Albumin 4.1 3.5 - 5.0 g/dL   AST 19 15 - 41 U/L   ALT 14 0 - 44 U/L   Alkaline Phosphatase 47 38 - 126 U/L   Total Bilirubin 0.5 0.3 - 1.2 mg/dL   GFR calc non Af Amer >60 >60 mL/min   GFR calc Af Amer >60 >60 mL/min   Anion gap 10 5 - 15    Comment: Performed at [facility], [address] [place], [place] [address]  Lipase, blood     Status: None   Collection Time: 01/16/19  8:51 AM  Result Value Ref Range   Lipase 29 11 - 51 U/L    Comment: Performed at [facility], [address] [place], [place] [address]   Ct Abdomen Pelvis W Contrast  Result Date: 01/16/2019 CLINICAL DATA:  Abdominal pain with nausea and vomiting EXAM: CT ABDOMEN AND PELVIS WITH CONTRAST TECHNIQUE: Multidetector CT imaging of the abdomen and pelvis was performed using the standard protocol following bolus administration of intravenous contrast. CONTRAST:  OMNIPAQUE IOHEXOL 300 MG/ML  SOLN COMPARISON:  None. FINDINGS: Lower chest: Lung bases are clear. Breast implants present bilaterally. Hepatobiliary: No focal liver lesions are appreciable. There is apparent sludge in the gallbladder with questionable tiny gallstones. Gallbladder wall appears mildly thickened with questionable mild pericholecystic fluid. There is slight intrahepatic biliary duct dilatation. There is no common bile duct dilatation. No biliary duct mass or calculus evident. Pancreas: There is no pancreatic mass or inflammatory focus. Spleen: No splenic lesions are evident. Tiny accessory spleen noted inferior to the spleen. Adrenals/Urinary Tract: Adrenals bilaterally appear normal. There is a 5 mm probable cyst in the lower pole left kidney. There is no evident hydronephrosis on either side. There is no evident renal or ureteral calculus on either side. Urinary bladder is  midline with wall thickness within normal limits. Stomach/Bowel: There is no appreciable bowel wall or mesenteric thickening. There is moderate stool in the right colon. There is no appreciable bowel obstruction. Terminal ileum appears unremarkable. There is no evident free air or portal venous air. Vascular/Lymphatic: There is no abdominal aortic aneurysm. No vascular lesions are evident on this study. There is no appreciable adenopathy in the abdomen or pelvis. Reproductive: The uterus is anteverted.  No  evident pelvic mass. Other: Appendix appears normal. No abscess or ascites is evident in the abdomen or pelvis. Musculoskeletal: There is degenerative change at L5-S1 with vacuum phenomenon at this level. No blastic or lytic bone lesions are evident. No intramuscular or abdominal wall lesions are evident. IMPRESSION: 1. There is apparent thickening of the gallbladder wall with questionable pericholecystic fluid. There is sludge with questionable superimposed gallstones in the gallbladder. Concern for a degree of cholecystitis; advise correlation with gallbladder ultrasound. 2. Slight intrahepatic biliary duct dilatation. No common bile duct dilatation. 3. No bowel obstruction. No abscess in the abdomen or pelvis. Appendix appears normal. 4. No evident renal or ureteral calculus. No hydronephrosis. Urinary bladder wall thickness is within normal limits. Electronically Signed   By: Lowella Grip III M.D.   On: 01/16/2019 10:33   US Abdomen Limited Ruq  Result Date: 01/16/2019 CLINICAL DATA:  Epigastric pain. Nausea, vomiting, diarrhea today. Abnormal CT. EXAM: ULTRASOUND ABDOMEN LIMITED RIGHT UPPER QUADRANT COMPARISON:  None. FINDINGS: Gallbladder: Multiple gallstones, largest measuring approximately 2 cm greatest dimension. Additional gallbladder sludge. Gallbladder wall thickening with a demonstrated measurement of 5 mm. Sonographer describes a probable obstructing stone at the gallbladder neck, but  difficult to demonstrate. Positive sonographic Murphy's sign was elicited during the exam. Common bile duct: Diameter: 3 mm Liver: No focal lesion identified. Within normal limits in parenchymal echogenicity. Portal vein is patent on color Doppler imaging with normal direction of blood flow towards the liver. Other: None. IMPRESSION: 1. Findings are consistent with acute cholecystitis. Multiple gallstones, with a probable obstructing stone at the gallbladder neck. 2. No bile duct dilatation. 3. Liver appears normal. Electronically Signed   By: Franki Cabot M.D.   On: 01/16/2019 11:13    Pending Labs Unresulted Labs (From admission, onward)    Start     Ordered   01/16/19 1138  Nunda  Once,   STAT     01/16/19 1137   01/16/19 1133  SARS CORONAVIRUS 2 (TAT 6-24 HRS) Nasopharyngeal Nasopharyngeal Swab  (Asymptomatic/Tier 3)  Once,   STAT    Question Answer Comment  Is this test for diagnosis or screening Screening   Symptomatic for COVID-19 as defined by CDC No   Hospitalized for COVID-19 No   Admitted to ICU for COVID-19 No   Previously tested for COVID-19 No   Resident in a congregate (group) care setting No   Employed in healthcare setting No   Pregnant No      01/16/19 1132          Vitals/Pain Today's Vitals   01/16/19 0838 01/16/19 0840 01/16/19 1144  BP:  (!) 111/51   Pulse:  63   Resp:  18   Temp:  97.7 F (36.5 C)   TempSrc:  Oral   SpO2:  100%   PainSc: 8   2     Isolation Precautions No active isolations  Medications Medications  HYDROmorphone (DILAUDID) injection 0.5 mg (has no administration in time range)  piperacillin-tazobactam (ZOSYN) IVPB 3.375 g (3.375 g Intravenous New Bag/Given 01/16/19 1144)  0.9 %  sodium chloride infusion (has no administration in time range)  morphine 4 MG/ML injection 4 mg (4 mg Intravenous Given 01/16/19 0854)  ondansetron (ZOFRAN) injection 4 mg (4 mg Intravenous Given 01/16/19 0852)  sodium chloride 0.9 % bolus 1,000 mL  (0 mLs Intravenous Stopped 01/16/19 1043)  iohexol (OMNIPAQUE) 300 MG/ML solution 100 mL (100 mLs Intravenous Contrast Given 01/16/19 1009)  morphine 4 MG/ML injection 4 mg (  4 mg Intravenous Given 01/16/19 1040)  promethazine (PHENERGAN) injection 25 mg (25 mg Intravenous Given 01/16/19 1040)    Mobility walks Low fall risk   Focused Assessments .   R Recommendations: See Admitting Provider Note  Report given to:   Additional Notes:

## 2019-01-16 NOTE — ED Triage Notes (Signed)
Pt here with epigastric abdominal pain, N/V and diarrhea since this morning.

## 2019-01-17 ENCOUNTER — Encounter (HOSPITAL_COMMUNITY): Payer: Self-pay

## 2019-01-17 ENCOUNTER — Observation Stay (HOSPITAL_COMMUNITY): Payer: BC Managed Care – PPO | Admitting: Certified Registered Nurse Anesthetist

## 2019-01-17 ENCOUNTER — Encounter (HOSPITAL_COMMUNITY): Admission: EM | Disposition: A | Discharge status: Home / Self Care | Payer: Self-pay | Source: Home / Self Care

## 2019-01-17 ENCOUNTER — Observation Stay (HOSPITAL_COMMUNITY): Payer: BC Managed Care – PPO

## 2019-01-17 HISTORY — PX: CHOLECYSTECTOMY: SHX55

## 2019-01-17 LAB — CBC
HCT: 38.8 % (ref 36.0–46.0)
Hemoglobin: 12.5 g/dL (ref 12.0–15.0)
MCH: 30.2 pg (ref 26.0–34.0)
MCHC: 32.2 g/dL (ref 30.0–36.0)
MCV: 93.7 fL (ref 80.0–100.0)
Platelets: 238 10*3/uL (ref 150–400)
RBC: 4.14 MIL/uL (ref 3.87–5.11)
RDW: 12.7 % (ref 11.5–15.5)
WBC: 9.1 10*3/uL (ref 4.0–10.5)
nRBC: 0 % (ref 0.0–0.2)

## 2019-01-17 LAB — COMPREHENSIVE METABOLIC PANEL
ALT: 13 U/L (ref 0–44)
AST: 18 U/L (ref 15–41)
Albumin: 3.5 g/dL (ref 3.5–5.0)
Alkaline Phosphatase: 47 U/L (ref 38–126)
Anion gap: 8 (ref 5–15)
BUN: 12 mg/dL (ref 6–20)
CO2: 25 mmol/L (ref 22–32)
Calcium: 8.5 mg/dL — ABNORMAL LOW (ref 8.9–10.3)
Chloride: 106 mmol/L (ref 98–111)
Creatinine, Ser: 0.66 mg/dL (ref 0.44–1.00)
GFR calc Af Amer: >60 mL/min (ref >60)
GFR calc non Af Amer: >60 mL/min (ref >60)
Glucose, Bld: 131 mg/dL — ABNORMAL HIGH (ref 70–99)
Potassium: 3.7 mmol/L (ref 3.5–5.1)
Sodium: 139 mmol/L (ref 135–145)
Total Bilirubin: 0.5 mg/dL (ref 0.3–1.2)
Total Protein: 6.2 g/dL — ABNORMAL LOW (ref 6.5–8.1)

## 2019-01-17 LAB — SURGICAL PCR SCREEN
MRSA, PCR: NEGATIVE
Staphylococcus aureus: NEGATIVE

## 2019-01-17 SURGERY — LAPAROSCOPIC CHOLECYSTECTOMY WITH INTRAOPERATIVE CHOLANGIOGRAM
Anesthesia: General | Site: Abdomen

## 2019-01-17 MED ORDER — MEPERIDINE HCL 50 MG/ML IJ SOLN: 6.2500 mg | INTRAMUSCULAR | Status: DC | PRN

## 2019-01-17 MED ORDER — ACETAMINOPHEN 10 MG/ML IV SOLN: 1000.0000 mg | Freq: Once | INTRAVENOUS | Status: DC | PRN

## 2019-01-17 MED ORDER — PROMETHAZINE HCL 25 MG/ML IJ SOLN: 6.2500 mg | INTRAMUSCULAR | Status: DC | PRN

## 2019-01-17 MED ORDER — PHENYLEPHRINE 40 MCG/ML (10ML) SYRINGE FOR IV PUSH (FOR BLOOD PRESSURE SUPPORT)
PREFILLED_SYRINGE | INTRAVENOUS | Status: DC | PRN
  Administered 2019-01-17 (×2): 80 ug via INTRAVENOUS
  Administered 2019-01-17: 120 ug via INTRAVENOUS
  Administered 2019-01-17: 40 ug via INTRAVENOUS
  Administered 2019-01-17: 120 ug via INTRAVENOUS

## 2019-01-17 MED ORDER — PROPOFOL 10 MG/ML IV BOLUS
INTRAVENOUS | Status: AC
  Filled 2019-01-17: qty 20

## 2019-01-17 MED ORDER — TRAMADOL HCL 50 MG PO TABS
50.0000 mg | ORAL_TABLET | Freq: Four times a day (QID) | ORAL | Status: DC | PRN
  Administered 2019-01-17: 100 mg via ORAL
  Administered 2019-01-18: 50 mg via ORAL
  Administered 2019-01-18: 02:00:00 100 mg via ORAL
  Filled 2019-01-17 (×3): qty 2

## 2019-01-17 MED ORDER — FENTANYL CITRATE (PF) 100 MCG/2ML IJ SOLN
INTRAMUSCULAR | Status: DC | PRN
  Administered 2019-01-17: 100 ug via INTRAVENOUS
  Administered 2019-01-17 (×3): 50 ug via INTRAVENOUS

## 2019-01-17 MED ORDER — MORPHINE SULFATE (PF) 2 MG/ML IV SOLN
1.0000 mg | INTRAVENOUS | Status: DC | PRN
  Administered 2019-01-17 – 2019-01-18 (×3): 2 mg via INTRAVENOUS
  Filled 2019-01-17 (×3): qty 1

## 2019-01-17 MED ORDER — LACTATED RINGERS IV SOLN
INTRAVENOUS | Status: DC
  Administered 2019-01-17 (×2): via INTRAVENOUS

## 2019-01-17 MED ORDER — ROCURONIUM BROMIDE 50 MG/5ML IV SOSY
PREFILLED_SYRINGE | INTRAVENOUS | Status: DC | PRN
  Administered 2019-01-17: 10 mg via INTRAVENOUS
  Administered 2019-01-17: 60 mg via INTRAVENOUS

## 2019-01-17 MED ORDER — SODIUM CHLORIDE 0.9 % IV SOLN
INTRAVENOUS | Status: DC | PRN
  Administered 2019-01-17: 7 mL

## 2019-01-17 MED ORDER — PHENYLEPHRINE 40 MCG/ML (10ML) SYRINGE FOR IV PUSH (FOR BLOOD PRESSURE SUPPORT)
PREFILLED_SYRINGE | INTRAVENOUS | Status: AC
  Filled 2019-01-17: qty 10

## 2019-01-17 MED ORDER — PROPOFOL 10 MG/ML IV BOLUS
INTRAVENOUS | Status: DC | PRN
  Administered 2019-01-17: 160 mg via INTRAVENOUS
  Administered 2019-01-17: 20 mg via INTRAVENOUS

## 2019-01-17 MED ORDER — HYDROMORPHONE HCL 1 MG/ML IJ SOLN
INTRAMUSCULAR | Status: AC
  Filled 2019-01-17: qty 1

## 2019-01-17 MED ORDER — HYDROMORPHONE HCL 1 MG/ML IJ SOLN
0.2500 mg | INTRAMUSCULAR | Status: DC | PRN
  Administered 2019-01-17: 0.5 mg via INTRAVENOUS

## 2019-01-17 MED ORDER — SCOPOLAMINE 1 MG/3DAYS TD PT72
1.0000 | MEDICATED_PATCH | TRANSDERMAL | Status: DC
  Administered 2019-01-17: 1.5 mg via TRANSDERMAL
  Filled 2019-01-17: qty 1

## 2019-01-17 MED ORDER — MIDAZOLAM HCL 2 MG/2ML IJ SOLN
INTRAMUSCULAR | Status: AC
  Filled 2019-01-17: qty 2

## 2019-01-17 MED ORDER — BUPIVACAINE-EPINEPHRINE 0.25% -1:200000 IJ SOLN
INTRAMUSCULAR | Status: DC | PRN
  Administered 2019-01-17: 30 mL

## 2019-01-17 MED ORDER — DEXAMETHASONE SODIUM PHOSPHATE 10 MG/ML IJ SOLN
INTRAMUSCULAR | Status: DC | PRN
  Administered 2019-01-17: 4 mg via INTRAVENOUS

## 2019-01-17 MED ORDER — ALUM & MAG HYDROXIDE-SIMETH 200-200-20 MG/5ML PO SUSP
30.0000 mL | Freq: Four times a day (QID) | ORAL | Status: DC | PRN
  Administered 2019-01-17 – 2019-01-18 (×2): 30 mL via ORAL
  Filled 2019-01-17 (×2): qty 30

## 2019-01-17 MED ORDER — HYDROCODONE-ACETAMINOPHEN 7.5-325 MG PO TABS: 1.0000 | ORAL_TABLET | Freq: Once | ORAL | Status: DC | PRN

## 2019-01-17 MED ORDER — LACTATED RINGERS IV SOLN
INTRAVENOUS | Status: AC | PRN
  Administered 2019-01-17: 1000 mL

## 2019-01-17 MED ORDER — KCL IN DEXTROSE-NACL 20-5-0.45 MEQ/L-%-% IV SOLN
INTRAVENOUS | Status: DC
  Administered 2019-01-17: 19:00:00 via INTRAVENOUS
  Filled 2019-01-17 (×2): qty 1000

## 2019-01-17 MED ORDER — ONDANSETRON HCL 4 MG/2ML IJ SOLN
INTRAMUSCULAR | Status: AC
  Filled 2019-01-17: qty 2

## 2019-01-17 MED ORDER — ROCURONIUM BROMIDE 10 MG/ML (PF) SYRINGE
PREFILLED_SYRINGE | INTRAVENOUS | Status: AC
  Filled 2019-01-17: qty 10

## 2019-01-17 MED ORDER — ONDANSETRON HCL 4 MG/2ML IJ SOLN
INTRAMUSCULAR | Status: DC | PRN
  Administered 2019-01-17: 4 mg via INTRAVENOUS

## 2019-01-17 MED ORDER — ACETAMINOPHEN 500 MG PO TABS
1000.0000 mg | ORAL_TABLET | Freq: Once | ORAL | Status: AC
  Administered 2019-01-17: 1000 mg via ORAL
  Filled 2019-01-17: qty 2

## 2019-01-17 MED ORDER — FENTANYL CITRATE (PF) 250 MCG/5ML IJ SOLN
INTRAMUSCULAR | Status: AC
  Filled 2019-01-17: qty 5

## 2019-01-17 MED ORDER — DEXAMETHASONE SODIUM PHOSPHATE 10 MG/ML IJ SOLN
INTRAMUSCULAR | Status: AC
  Filled 2019-01-17: qty 1

## 2019-01-17 MED ORDER — LIDOCAINE 2% (20 MG/ML) 5 ML SYRINGE
INTRAMUSCULAR | Status: DC | PRN
  Administered 2019-01-17: 80 mg via INTRAVENOUS

## 2019-01-17 MED ORDER — MIDAZOLAM HCL 5 MG/5ML IJ SOLN
INTRAMUSCULAR | Status: DC | PRN
  Administered 2019-01-17: 2 mg via INTRAVENOUS

## 2019-01-17 MED ORDER — ARTIFICIAL TEARS OPHTHALMIC OINT
TOPICAL_OINTMENT | OPHTHALMIC | Status: AC
  Filled 2019-01-17: qty 3.5

## 2019-01-17 MED ORDER — BUPIVACAINE-EPINEPHRINE 0.25% -1:200000 IJ SOLN
INTRAMUSCULAR | Status: AC
  Filled 2019-01-17: qty 1

## 2019-01-17 MED ORDER — SUGAMMADEX SODIUM 200 MG/2ML IV SOLN
INTRAVENOUS | Status: DC | PRN
  Administered 2019-01-17: 200 mg via INTRAVENOUS

## 2019-01-17 MED ORDER — LIDOCAINE 2% (20 MG/ML) 5 ML SYRINGE
INTRAMUSCULAR | Status: AC
  Filled 2019-01-17: qty 5

## 2019-01-17 MED ORDER — SUCCINYLCHOLINE CHLORIDE 200 MG/10ML IV SOSY
PREFILLED_SYRINGE | INTRAVENOUS | Status: AC
  Filled 2019-01-17: qty 10

## 2019-01-17 SURGICAL SUPPLY — 43 items
APL PRP STRL LF DISP 70% ISPRP (MISCELLANEOUS) ×1
APPLIER CLIP 5 13 M/L LIGAMAX5 (MISCELLANEOUS) ×3
APPLIER CLIP ROT 10 11.4 M/L (STAPLE) ×3
CABLE HIGH FREQUENCY MONO STRZ (ELECTRODE) ×3 IMPLANT
CHLORAPREP W/TINT 26 (MISCELLANEOUS) ×3 IMPLANT
CHOLANGIOGRAM CATH TAUT (CATHETERS) ×3 IMPLANT
CLIP APPLIE 5 13 M/L LIGAMAX5 (MISCELLANEOUS) ×1 IMPLANT
CLIP APPLIE ROT 10 11.4 M/L (STAPLE) ×1 IMPLANT
CLOSURE WOUND 1/4X4 (GAUZE/BANDAGES/DRESSINGS)
COVER MAYO STAND STRL (DRAPES) ×3 IMPLANT
COVER SURGICAL LIGHT HANDLE (MISCELLANEOUS) ×3 IMPLANT
COVER WAND RF STERILE (DRAPES) IMPLANT
DECANTER SPIKE VIAL GLASS SM (MISCELLANEOUS) ×3 IMPLANT
DERMABOND ADVANCED (GAUZE/BANDAGES/DRESSINGS) ×2
DERMABOND ADVANCED .7 DNX12 (GAUZE/BANDAGES/DRESSINGS) ×1 IMPLANT
DRAPE C-ARM 42X120 X-RAY (DRAPES) ×3 IMPLANT
ELECT REM PT RETURN 15FT ADLT (MISCELLANEOUS) ×3 IMPLANT
GLOVE SURG SYN 7.5  E (GLOVE) ×2
GLOVE SURG SYN 7.5 E (GLOVE) ×1 IMPLANT
GOWN STRL REUS W/TWL XL LVL3 (GOWN DISPOSABLE) ×9 IMPLANT
HEMOSTAT SURGICEL 4X8 (HEMOSTASIS) IMPLANT
IV CATH 14GX2 1/4 (CATHETERS) ×3 IMPLANT
IV SET EXTENSION CATH 6 NF (IV SETS) ×3 IMPLANT
KIT BASIN OR (CUSTOM PROCEDURE TRAY) ×3 IMPLANT
KIT TURNOVER KIT A (KITS) IMPLANT
PENCIL SMOKE EVACUATOR (MISCELLANEOUS) IMPLANT
POUCH RETRIEVAL ECOSAC 10 (ENDOMECHANICALS) ×1 IMPLANT
POUCH RETRIEVAL ECOSAC 10MM (ENDOMECHANICALS) ×2
SCISSORS LAP 5X35 DISP (ENDOMECHANICALS) ×3 IMPLANT
SET IRRIG TUBING LAPAROSCOPIC (IRRIGATION / IRRIGATOR) ×3 IMPLANT
SET TUBE SMOKE EVAC HIGH FLOW (TUBING) ×3 IMPLANT
SLEEVE ADV FIXATION 5X100MM (TROCAR) ×3 IMPLANT
STOPCOCK 4 WAY LG BORE MALE ST (IV SETS) ×3 IMPLANT
STRIP CLOSURE SKIN 1/4X4 (GAUZE/BANDAGES/DRESSINGS) IMPLANT
SUT MNCRL AB 4-0 PS2 18 (SUTURE) ×3 IMPLANT
SUT VICRYL 0 UR6 27IN ABS (SUTURE) ×3 IMPLANT
SYR 10ML ECCENTRIC (SYRINGE) ×3 IMPLANT
TOWEL OR 17X26 10 PK STRL BLUE (TOWEL DISPOSABLE) ×3 IMPLANT
TOWEL OR NON WOVEN STRL DISP B (DISPOSABLE) ×3 IMPLANT
TRAY LAPAROSCOPIC (CUSTOM PROCEDURE TRAY) ×3 IMPLANT
TROCAR ADV FIXATION 11X100MM (TROCAR) ×3 IMPLANT
TROCAR ADV FIXATION 5X100MM (TROCAR) ×3 IMPLANT
TROCAR XCEL BLUNT TIP 100MML (ENDOMECHANICALS) ×3 IMPLANT

## 2019-01-17 NOTE — Progress Notes (Signed)
[facility] Office:  [phone] General Surgery Progress Note   LOS: 1 day  POD -     Chief Complaint: Abdominal pain  Assessment and Plan: 1.  Cholecystitis, cholelithiasis  I discussed with the patient the indications and risks of gall bladder surgery.  The primary risks of gall bladder surgery include, but are not limited to, bleeding, infection, common bile duct injury, and open surgery.  There is also the risk that the patient may have continued symptoms after surgery.  We discussed the typical post-operative recovery course. I tried to answer the patient's questions.  For lap chole later this AM.  2.  DVT prophylaxis - on hold for surgery   Active Problems:   Cholecystitis, acute   Cholecystitis with cholelithiasis  Subjective:  She had 2 attacks before - but was not sure what they were.  Had a bad attack Saturday night - went to [facility] and was transferred to [facility].  Patty Sermons (has sciatica, laid up at home).  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  She has one daughter, Ship broker at [employer].  She works as a Animal nutritionist at [employer].  Objective:   Vitals:   01/17/19 0113 01/17/19 0503  BP: 120/61 (!) 115/55  Pulse: 67 65  Resp: 16 16  Temp: 98.9 F (37.2 C) 99.1 F (37.3 C)  SpO2: 99% 99%     Intake/Output from previous day:  11/29 0701 - 11/30 0700 In: 1665.7 [P.O.:60; I.V.:455.7; IV Piggyback:1150] Out: 2400 [Urine:2400]  Intake/Output this shift:  No intake/output data recorded.   Physical Exam:   General: WN WF who is alert and oriented.    HEENT: Normal. Pupils equal. .   Lungs: Clear   Abdomen: Soft     Lab Results:    Recent Labs    01/16/19 0851 01/17/19 0427  WBC 8.8 9.1  HGB 12.8 12.5  HCT 39.5 38.8  PLT 243 238    BMET   Recent Labs    01/16/19 0851 01/17/19 0427  NA 139 139  K 3.7 3.7  CL 101 106  CO2 28 25  GLUCOSE 137* 131*  BUN 25* 12  CREATININE  0.65 0.66  CALCIUM 8.9 8.5*    PT/INR   Recent Labs    01/16/19 0851  LABPROT 12.1  INR 0.9    ABG  No results for input(s): PHART, HCO3 in the last 72 hours.  Invalid input(s): PCO2, PO2   Studies/Results:  Ct Abdomen Pelvis W Contrast  Result Date: 01/16/2019 CLINICAL DATA:  Abdominal pain with nausea and vomiting EXAM: CT ABDOMEN AND PELVIS WITH CONTRAST TECHNIQUE: Multidetector CT imaging of the abdomen and pelvis was performed using the standard protocol following bolus administration of intravenous contrast. CONTRAST:  136mL OMNIPAQUE IOHEXOL 300 MG/ML  SOLN COMPARISON:  None. FINDINGS: Lower chest: Lung bases are clear. Breast implants present bilaterally. Hepatobiliary: No focal liver lesions are appreciable. There is apparent sludge in the gallbladder with questionable tiny gallstones. Gallbladder wall appears mildly thickened with questionable mild pericholecystic fluid. There is slight intrahepatic biliary duct dilatation. There is no common bile duct dilatation. No biliary duct mass or calculus evident. Pancreas: There is no pancreatic mass or inflammatory focus. Spleen: No splenic lesions are evident. Tiny accessory spleen noted inferior to the spleen. Adrenals/Urinary Tract: Adrenals bilaterally appear normal. There is a 5 mm probable cyst in the lower pole left kidney. There is no  evident hydronephrosis on either side. There is no evident renal or ureteral calculus on either side. Urinary bladder is midline with wall thickness within normal limits. Stomach/Bowel: There is no appreciable bowel wall or mesenteric thickening. There is moderate stool in the right colon. There is no appreciable bowel obstruction. Terminal ileum appears unremarkable. There is no evident free air or portal venous air. Vascular/Lymphatic: There is no abdominal aortic aneurysm. No vascular lesions are evident on this study. There is no appreciable adenopathy in the abdomen or pelvis. Reproductive: The  uterus is anteverted.  No evident pelvic mass. Other: Appendix appears normal. No abscess or ascites is evident in the abdomen or pelvis. Musculoskeletal: There is degenerative change at L5-S1 with vacuum phenomenon at this level. No blastic or lytic bone lesions are evident. No intramuscular or abdominal wall lesions are evident. IMPRESSION: 1. There is apparent thickening of the gallbladder wall with questionable pericholecystic fluid. There is sludge with questionable superimposed gallstones in the gallbladder. Concern for a degree of cholecystitis; advise correlation with gallbladder ultrasound. 2. Slight intrahepatic biliary duct dilatation. No common bile duct dilatation. 3. No bowel obstruction. No abscess in the abdomen or pelvis. Appendix appears normal. 4. No evident renal or ureteral calculus. No hydronephrosis. Urinary bladder wall thickness is within normal limits. Electronically Signed   By: Bretta Bang III M.D.   On: 01/16/2019 10:33   US Abdomen Limited Ruq  Result Date: 01/16/2019 CLINICAL DATA:  Epigastric pain. Nausea, vomiting, diarrhea today. Abnormal CT. EXAM: ULTRASOUND ABDOMEN LIMITED RIGHT UPPER QUADRANT COMPARISON:  None. FINDINGS: Gallbladder: Multiple gallstones, largest measuring approximately 2 cm greatest dimension. Additional gallbladder sludge. Gallbladder wall thickening with a demonstrated measurement of 5 mm. Sonographer describes a probable obstructing stone at the gallbladder neck, but difficult to demonstrate. Positive sonographic Murphy's sign was elicited during the exam. Common bile duct: Diameter: 3 mm Liver: No focal lesion identified. Within normal limits in parenchymal echogenicity. Portal vein is patent on color Doppler imaging with normal direction of blood flow towards the liver. Other: None. IMPRESSION: 1. Findings are consistent with acute cholecystitis. Multiple gallstones, with a probable obstructing stone at the gallbladder neck. 2. No bile duct  dilatation. 3. Liver appears normal. Electronically Signed   By: Bary Richard M.D.   On: 01/16/2019 11:13     Anti-infectives:   Anti-infectives (From admission, onward)   Start     Dose/Rate Route Frequency Ordered Stop   01/16/19 1600  cefTRIAXone (ROCEPHIN) 2 g in sodium chloride 0.9 % 100 mL IVPB     2 g 200 mL/hr over 30 Minutes Intravenous Every 24 hours 01/16/19 1446     01/16/19 1145  piperacillin-tazobactam (ZOSYN) IVPB 3.375 g     3.375 g 100 mL/hr over 30 Minutes Intravenous  Once 01/16/19 1135 01/16/19 1218      Ovidio Kin, MD, [facility] Office: [phone] 01/17/2019

## 2019-01-17 NOTE — Anesthesia Procedure Notes (Signed)
Procedure Name: Intubation Date/Time: 01/17/2019 2:52 PM Performed by: Montel Clock, CRNA Pre-anesthesia Checklist: Patient identified, Emergency Drugs available, Suction available, Patient being monitored and Timeout performed Patient Re-evaluated:Patient Re-evaluated prior to induction Oxygen Delivery Method: Circle system utilized Preoxygenation: Pre-oxygenation with 100% oxygen Induction Type: IV induction Ventilation: Mask ventilation without difficulty Laryngoscope Size: Mac and 3 Grade View: Grade II Tube type: Oral Tube size: 7.0 mm Number of attempts: 1 Airway Equipment and Method: Stylet Placement Confirmation: ETT inserted through vocal cords under direct vision,  positive ETCO2 and breath sounds checked- equal and bilateral Secured at: 21 cm Tube secured with: Tape Dental Injury: Teeth and Oropharynx as per pre-operative assessment

## 2019-01-17 NOTE — Anesthesia Preprocedure Evaluation (Addendum)
Anesthesia Evaluation  Patient identified by MRN, date of birth, ID band Patient awake    Reviewed: Allergy & Precautions, NPO status , Patient's Chart, lab work & pertinent test results  History of Anesthesia Complications Negative for: history of anesthetic complications  Airway Mallampati: II  TM Distance: >3 FB Neck ROM: Full    Dental no notable dental hx. (+) Teeth Intact   Pulmonary neg pulmonary ROS,    Pulmonary exam normal breath sounds clear to auscultation       Cardiovascular negative cardio ROS Normal cardiovascular exam Rhythm:Regular Rate:Normal     Neuro/Psych Anxiety negative neurological ROS     GI/Hepatic negative GI ROS, Neg liver ROS,   Endo/Other    Renal/GU negative Renal ROS     Musculoskeletal negative musculoskeletal ROS (+)   Abdominal   Peds  Hematology negative hematology ROS (+)   Anesthesia Other Findings   Reproductive/Obstetrics                            Anesthesia Physical Anesthesia Plan  ASA: II  Anesthesia Plan: General   Post-op Pain Management:    Induction: Intravenous  PONV Risk Score and Plan: 3 and Treatment may vary due to age or medical condition, Ondansetron, Dexamethasone, Scopolamine patch - Pre-op and Midazolam  Airway Management Planned: Oral ETT  Additional Equipment: None  Intra-op Plan:   Post-operative Plan: Extubation in OR  Informed Consent: I have reviewed the patients History and Physical, chart, labs and discussed the procedure including the risks, benefits and alternatives for the proposed anesthesia with the patient or authorized representative who has indicated his/her understanding and acceptance.     Dental advisory given  Plan Discussed with: CRNA  Anesthesia Plan Comments:         Anesthesia Quick Evaluation

## 2019-01-17 NOTE — Op Note (Signed)
01/16/2019 - 01/17/2019  4:20 PM  PATIENT:  Charlotte Hudson, 53 y.o., female, MRN: [id number]  PREOP DIAGNOSIS:  Cholelithiasis, cholecystitis  POSTOP DIAGNOSIS:   Acute edematous cholecystitis, cholelithiasis  PROCEDURE:   Procedure(s):  LAPAROSCOPIC CHOLECYSTECTOMY WITH INTRAOPERATIVE CHOLANGIOGRAM  SURGEON:   Alphonsa Overall, M.D.  ASSISTANT:   Jackson Latino, P.A.  ANESTHESIA:   general  Anesthesiologist: Brennan Bailey, MD CRNA: Montel Clock, CRNA  General  ASA: 2E  EBL:  minimal  ml  BLOOD ADMINISTERED: none  DRAINS: none   LOCAL MEDICATIONS USED:   30 cc 1/4% marcaine  SPECIMEN:   Gall bladder  COUNTS CORRECT:  YES  INDICATIONS FOR PROCEDURE:  Takya Vandivier is a 53 y.o. (DOB: 10-Oct-1965) white female whose primary care physician is Delilah Shan, MD and comes for cholecystectomy.   The indications and risks of the gall bladder surgery were explained to the patient.  The risks include, but are not limited to, infection, bleeding, common bile duct injury and open surgery.  SURGERY:  The patient was taken to OR room #[facility] at [facility].  The abdomen was prepped with chloroprep.  The patient was given 2 gm Ancef at the beginning of the operation.   A time out was held and the surgical checklist run.   An infraumbilical incision was made into the abdominal cavity.  A 12 mm Hasson trocar was inserted into the abdominal cavity through the infraumbilical incision and secured with a 0 Vicryl suture.  Three additional trocars were inserted: a 10 mm trocar in the sub-xiphoid location, a 5 mm trocar in the right mid subcostal area, and a 5 mm trocar in the right lateral subcostal area.   The abdomen was explored and the liver, stomach, and bowel that could be seen were unremarkable.   The gall bladder was edematous and inflamed.   I decompressed the gall bladder and aspirated pale green bile.   I grasped the gall bladder and rotated it cephalad.  Disssection was  carried down to the gall bladder/cystic duct junction and the cystic duct isolated.  A clip was placed on the gall bladder side of the cystic duct.   An intra-operative cholangiogram was shot.   The intra-operative cholangiogram was shot using a cut off Taut catheter placed through a 14 gauge angiocath in the RUQ.  The Taut catheter was inserted in the cut cystic duct and secured with an endoclip.  A cholangiogram was shot with 10 cc of 1/2 strength Isoview.  Using fluoroscopy, the cholangiogram showed the flow of contrast into the common bile duct, up the hepatic radicals, and into the duodenum.  There was no mass or obstruction.  This was a normal intra-operative cholangiogram.   The Taut catheter was removed.  The cystic duct was tripley endoclipped and the cystic artery was identified and clipped.  The gall bladder was bluntly and sharpley dissected from the gall bladder bed.   After the gall bladder was removed from the liver, the gall bladder bed and Tri[place] of [place] were inspected.  There was no bleeding or bile leak.  The gall bladder was placed in a Ecco Sac bag and delivered through the umbilicus.  The abdomen was irrigated with 1,000 cc saline.   The trocars were then removed.  I infiltrated 30 cc of 1/4% Marcaine into the incisions.  The umbilical port closed with a 0 Vicryl suture and the skin closed with 4-0 Monocryl.  The skin was painted with DermaBond.  The  patient's sponge and needle count were correct.  The patient was transported to the RR in good condition.  Alphonsa Overall, MD, [facility] Pager: [phone] Office phone:  [phone]

## 2019-01-17 NOTE — Transfer of Care (Signed)
Immediate Anesthesia Transfer of Care Note  Patient: Charlotte Hudson  Procedure(s) Performed: LAPAROSCOPIC CHOLECYSTECTOMY WITH INTRAOPERATIVE CHOLANGIOGRAM (N/A Abdomen)  Patient Location: PACU  Anesthesia Type:General  Level of Consciousness: drowsy and patient cooperative  Airway & Oxygen Therapy: Patient Spontanous Breathing and Patient connected to face mask oxygen  Post-op Assessment: Report given to RN and Post -op Vital signs reviewed and stable  Post vital signs: Reviewed and stable  Last Vitals:  Vitals Value Taken Time  BP 114/59 01/17/19 1645  Temp    Pulse 65 01/17/19 1649  Resp 17 01/17/19 1649  SpO2 100 % 01/17/19 1649  Vitals shown include unvalidated device data.  Last Pain:  Vitals:   01/17/19 1645  TempSrc:   PainSc: (P) 7       Patients Stated Pain Goal: (P) 3 (94/17/40 8144)  Complications: No apparent anesthesia complications

## 2019-01-18 ENCOUNTER — Encounter (HOSPITAL_COMMUNITY): Payer: Self-pay | Admitting: Surgery

## 2019-01-18 MED ORDER — TRAMADOL HCL 50 MG PO TABS: 50.0000 mg | ORAL_TABLET | Freq: Four times a day (QID) | ORAL | 0 refills | Status: AC | PRN

## 2019-01-18 NOTE — Discharge Summary (Addendum)
[facility]/Trauma Discharge Summary   Patient ID: Desire Fulp MRN: [id number] DOB/AGE: 21-Sep-1965 53 y.o.  Admit date: 01/16/2019 Discharge date: 01/18/2019  Admitting Diagnosis: Cholecystitis Cholelithiasis   Discharge Diagnosis Patient Active Problem List   Diagnosis Date Noted  . Cholecystitis, acute 01/16/2019  . Cholecystitis with cholelithiasis 01/16/2019  . Performance anxiety 05/02/2015  . Right hip pain 04/10/2014  . Acute upper respiratory infection 08/19/2013  . Excessive sweating 08/19/2013  . Social phobia 08/19/2013    Consultants none  Imaging: Dg Cholangiogram Operative  Result Date: 01/17/2019 CLINICAL DATA:  53 year old female undergoing laparoscopic cholecystectomy. EXAM: INTRAOPERATIVE CHOLANGIOGRAM TECHNIQUE: Cholangiographic images from the C-arm fluoroscopic device were submitted for interpretation post-operatively. Please see the procedural report for the amount of contrast and the fluoroscopy time utilized. COMPARISON:  Abdominal ultrasound 01/16/2019 FINDINGS: A single intraoperative cine clip is submitted for review. The images demonstrate cannulation of the cystic duct remanent and intraoperative cholangiogram. No evidence of biliary dilatation, stenosis, stricture or choledocholithiasis. Variant biliary anatomy. The right posterior ducts drain into the left hepatic duct. IMPRESSION: Negative intraoperative cholangiogram. Electronically Signed   By: Jacqulynn Cadet M.D.   On: 01/17/2019 16:26   Ct Abdomen Pelvis W Contrast  Result Date: 01/16/2019 CLINICAL DATA:  Abdominal pain with nausea and vomiting EXAM: CT ABDOMEN AND PELVIS WITH CONTRAST TECHNIQUE: Multidetector CT imaging of the abdomen and pelvis was performed using the standard protocol following bolus administration of intravenous contrast. CONTRAST:  141mL OMNIPAQUE IOHEXOL 300 MG/ML  SOLN COMPARISON:  None. FINDINGS: Lower chest: Lung bases are clear. Breast implants  present bilaterally. Hepatobiliary: No focal liver lesions are appreciable. There is apparent sludge in the gallbladder with questionable tiny gallstones. Gallbladder wall appears mildly thickened with questionable mild pericholecystic fluid. There is slight intrahepatic biliary duct dilatation. There is no common bile duct dilatation. No biliary duct mass or calculus evident. Pancreas: There is no pancreatic mass or inflammatory focus. Spleen: No splenic lesions are evident. Tiny accessory spleen noted inferior to the spleen. Adrenals/Urinary Tract: Adrenals bilaterally appear normal. There is a 5 mm probable cyst in the lower pole left kidney. There is no evident hydronephrosis on either side. There is no evident renal or ureteral calculus on either side. Urinary bladder is midline with wall thickness within normal limits. Stomach/Bowel: There is no appreciable bowel wall or mesenteric thickening. There is moderate stool in the right colon. There is no appreciable bowel obstruction. Terminal ileum appears unremarkable. There is no evident free air or portal venous air. Vascular/Lymphatic: There is no abdominal aortic aneurysm. No vascular lesions are evident on this study. There is no appreciable adenopathy in the abdomen or pelvis. Reproductive: The uterus is anteverted.  No evident pelvic mass. Other: Appendix appears normal. No abscess or ascites is evident in the abdomen or pelvis. Musculoskeletal: There is degenerative change at L5-S1 with vacuum phenomenon at this level. No blastic or lytic bone lesions are evident. No intramuscular or abdominal wall lesions are evident. IMPRESSION: 1. There is apparent thickening of the gallbladder wall with questionable pericholecystic fluid. There is sludge with questionable superimposed gallstones in the gallbladder. Concern for a degree of cholecystitis; advise correlation with gallbladder ultrasound. 2. Slight intrahepatic biliary duct dilatation. No common bile duct  dilatation. 3. No bowel obstruction. No abscess in the abdomen or pelvis. Appendix appears normal. 4. No evident renal or ureteral calculus. No hydronephrosis. Urinary bladder wall thickness is within normal limits. Electronically Signed   By: Lowella Grip III M.D.  On: 01/16/2019 10:33   US Abdomen Limited Ruq  Result Date: 01/16/2019 CLINICAL DATA:  Epigastric pain. Nausea, vomiting, diarrhea today. Abnormal CT. EXAM: ULTRASOUND ABDOMEN LIMITED RIGHT UPPER QUADRANT COMPARISON:  None. FINDINGS: Gallbladder: Multiple gallstones, largest measuring approximately 2 cm greatest dimension. Additional gallbladder sludge. Gallbladder wall thickening with a demonstrated measurement of 5 mm. Sonographer describes a probable obstructing stone at the gallbladder neck, but difficult to demonstrate. Positive sonographic Murphy's sign was elicited during the exam. Common bile duct: Diameter: 3 mm Liver: No focal lesion identified. Within normal limits in parenchymal echogenicity. Portal vein is patent on color Doppler imaging with normal direction of blood flow towards the liver. Other: None. IMPRESSION: 1. Findings are consistent with acute cholecystitis. Multiple gallstones, with a probable obstructing stone at the gallbladder neck. 2. No bile duct dilatation. 3. Liver appears normal. Electronically Signed   By: Bary Richard M.D.   On: 01/16/2019 11:13    Procedures Dr. Ezzard Standing (01/17/2019) - Laparoscopic Cholecystectomy with IOC  HPI: The patient is a 53 year old white female who presents with RUQ abd pain that started about 2 am. It was associated with nausea and vomiting and diarrhea. Denies fever or chills. She had a similar episode a couple months ago that went away quickly. She came to ER where CT and u/s show stones gallbladder and thickening of gb wall. lft's normal.  Hospital Course:  Workup showed cholecystitis.  Patient was admitted and underwent procedure listed above.  Tolerated procedure well  and was transferred to the floor.  Diet was advanced as tolerated.  On POD#1, the patient was voiding well, tolerating diet, ambulating well, pain well controlled, vital signs stable, incisions c/d/i and felt stable for discharge home.  Patient will follow up as outlined below and knows to call with questions or concerns.     Patient was discharged in good condition.  The [place] Substance controlled database was reviewed prior to prescribing narcotic pain medication to this patient.  Physical Exam: General:  Alert, NAD, pleasant, cooperative Cardio: RRR, S1 & S2 normal, no murmur, rubs, gallops Resp: Effort normal, lungs CTA bilaterally, no wheezes, rales, rhonchi Abd:  Soft, ND, normal bowel sounds, very mild generalized TTP without guarding, no peritonitis. incisions with glue intact are without drainage or bleeding. Some mild ecchymosis around incisions  Skin: warm and dry, no rashes noted  Allergies as of 01/18/2019      Reactions   Sulfa Antibiotics Nausea And Vomiting, Swelling      Medication List    TAKE these medications   Calcium-Magnesium-Zinc 333-133-5 MG Tabs Take 1 tablet by mouth at bedtime.   estradiol 1 MG tablet Commonly known as: ESTRACE Take 1 mg by mouth at bedtime.   progesterone 100 MG capsule Commonly known as: PROMETRIUM Take 100 mg by mouth at bedtime.   propranolol 20 MG tablet Commonly known as: INDERAL Take 20 mg by mouth daily as needed (anxiety).   traMADol 50 MG tablet Commonly known as: ULTRAM Take 1 tablet (50 mg total) by mouth every 6 (six) hours as needed for moderate pain.   VITAMIN C PO Take 1 tablet by mouth at bedtime.        Follow-up Information    [facility] Follow up.   Specialty: General Surgery Why: A provider will call you. Please call our office to see when your telehealth appt is. Please send photos of your incisions to [email or web address] 2 days prior. Please include date of  birth  and name in subject line.  Contact information: [address] [place] [id number] [phone]          Signed: Joyce CopaJessica L [facility] 01/18/2019, 9:05 AM Please see amion for pager for the following: M, T, W, & Friday 7:00am - 4:30pm Thursdays 7:00am -11:30am  Looks good today. Agree with above. I discussed televisit for follow up (phone call).  Ovidio Kinavid Catricia Scheerer, MD, [facility] Office phone:  [phone]

## 2019-01-18 NOTE — Progress Notes (Signed)
Discharge and medication instructions reviewed with patiwnt. Questions answered and patient denies further questions. No prescriptions given. Family member en route to drive patient home. Donne Hazel, RN

## 2019-01-18 NOTE — Discharge Instructions (Signed)
[facility] ______[facility] °LAPAROSCOPIC SURGERY: POST OP INSTRUCTIONS °Always review your discharge instruction sheet given to you by the facility where your surgery was performed. °IF YOU HAVE DISABILITY OR FAMILY LEAVE FORMS, YOU MUST BRING THEM TO THE OFFICE FOR PROCESSING.   °DO NOT GIVE THEM TO YOUR DOCTOR. ° °1. A prescription for pain medication may be given to you upon discharge.  Take your pain medication as prescribed, if needed.  If narcotic pain medicine is not needed, then you may take acetaminophen (Tylenol) or ibuprofen (Advil) as needed. °2. Take your usually prescribed medications unless otherwise directed. °3. If you need a refill on your pain medication, please contact your pharmacy.  They will contact our office to request authorization. Prescriptions will not be filled after 5pm or on week-ends. °4. You should follow a light diet the first few days after arrival home, such as soup and crackers, etc.  Be sure to include lots of fluids daily. °5. Most patients will experience some swelling and bruising in the area of the incisions.  Ice packs will help.  Swelling and bruising can take several days to resolve.  °6. It is common to experience some constipation if taking pain medication after surgery.  Increasing fluid intake and taking a stool softener (such as Colace) will usually help or prevent this problem from occurring.  A mild laxative (Milk of Magnesia or Miralax) should be taken according to package instructions if there are no bowel movements after 48 hours. °7. Unless discharge instructions indicate otherwise, you may remove your bandages 24-48 hours after surgery, and you may shower at that time.  You may have steri-strips (small skin tapes) in place directly over the incision.  These strips should be left on the skin for 7-10 days.  If your surgeon used skin glue on the incision, you may shower in 24 hours.  The glue will flake off over the next 2-3 weeks.  Any sutures or  staples will be removed at the office during your follow-up visit. °8. ACTIVITIES:  You may resume regular (light) daily activities beginning the next day--such as daily self-care, walking, climbing stairs--gradually increasing activities as tolerated.  You may have sexual intercourse when it is comfortable.  Refrain from any heavy lifting or straining until approved by your doctor. °a. You may drive when you are no longer taking prescription pain medication, you can comfortably wear a seatbelt, and you can safely maneuver your car and apply brakes. °b. RETURN TO WORK:  __________________________________________________________ °9. You should see your doctor in the office for a follow-up appointment approximately 2-3 weeks after your surgery.  Make sure that you call for this appointment within a day or two after you arrive home to insure a convenient appointment time. °10. OTHER INSTRUCTIONS: __________________________________________________________________________________________________________________________ __________________________________________________________________________________________________________________________ °WHEN TO CALL YOUR DOCTOR: °1. Fever over 101.0 °2. Inability to urinate °3. Continued bleeding from incision. °4. Increased pain, redness, or drainage from the incision. °5. Increasing abdominal pain ° °The clinic staff is available to answer your questions during regular business hours.  Please don’t hesitate to call and ask to speak to one of the nurses for clinical concerns.  If you have a medical emergency, go to the nearest emergency room or call 911.  A surgeon from [facility] is always on call at the hospital. °[address], [address], [place], [place]  [address] ? [address] [address], [place], [place]   [address] °[phone] ? [phone] ? FAX [phone] °Web site:   [email or web address] °

## 2019-01-18 NOTE — Anesthesia Postprocedure Evaluation (Signed)
Anesthesia Post Note  Patient: Charlotte Hudson  Procedure(s) Performed: LAPAROSCOPIC CHOLECYSTECTOMY WITH INTRAOPERATIVE CHOLANGIOGRAM (N/A Abdomen)     Patient location during evaluation: PACU Anesthesia Type: General Level of consciousness: awake and alert and oriented Pain management: pain level controlled Vital Signs Assessment: post-procedure vital signs reviewed and stable Respiratory status: spontaneous breathing, nonlabored ventilation and respiratory function stable Cardiovascular status: blood pressure returned to baseline Postop Assessment: no apparent nausea or vomiting Anesthetic complications: no                  Brennan Bailey

## 2019-01-25 LAB — SURGICAL PATHOLOGY

## 2020-10-29 ENCOUNTER — Other Ambulatory Visit: Payer: Self-pay | Admitting: Obstetrics and Gynecology

## 2020-10-29 DIAGNOSIS — R928 Other abnormal and inconclusive findings on diagnostic imaging of breast: Secondary | ICD-10-CM

## 2020-11-02 ENCOUNTER — Ambulatory Visit: Payer: BC Managed Care – PPO

## 2020-11-02 ENCOUNTER — Other Ambulatory Visit: Payer: Self-pay

## 2020-11-02 ENCOUNTER — Ambulatory Visit
Admission: RE | Admit: 2020-11-02 | Discharge: 2020-11-02 | Disposition: A | Discharge status: Home / Self Care | Payer: BC Managed Care – PPO | Source: Ambulatory Visit | Attending: Obstetrics and Gynecology | Admitting: Obstetrics and Gynecology

## 2020-11-02 DIAGNOSIS — R928 Other abnormal and inconclusive findings on diagnostic imaging of breast: Secondary | ICD-10-CM

## 2021-01-17 IMAGING — RF DG CHOLANGIOGRAM OPERATIVE
1 series · 4 of 4 positions shown · non-contrast
Comparison: Abdominal ultrasound 01/16/2019

CLINICAL DATA: 52-year-old female undergoing laparoscopic
cholecystectomy.

EXAM:
INTRAOPERATIVE CHOLANGIOGRAM
TECHNIQUE: Cholangiographic images from the C-arm fluoroscopic device were
submitted for interpretation post-operatively. Please see the
procedural report for the amount of contrast and the fluoroscopy
time utilized.

[Series 1: run · 4 of 37 frames shown]
[frame 6/37]
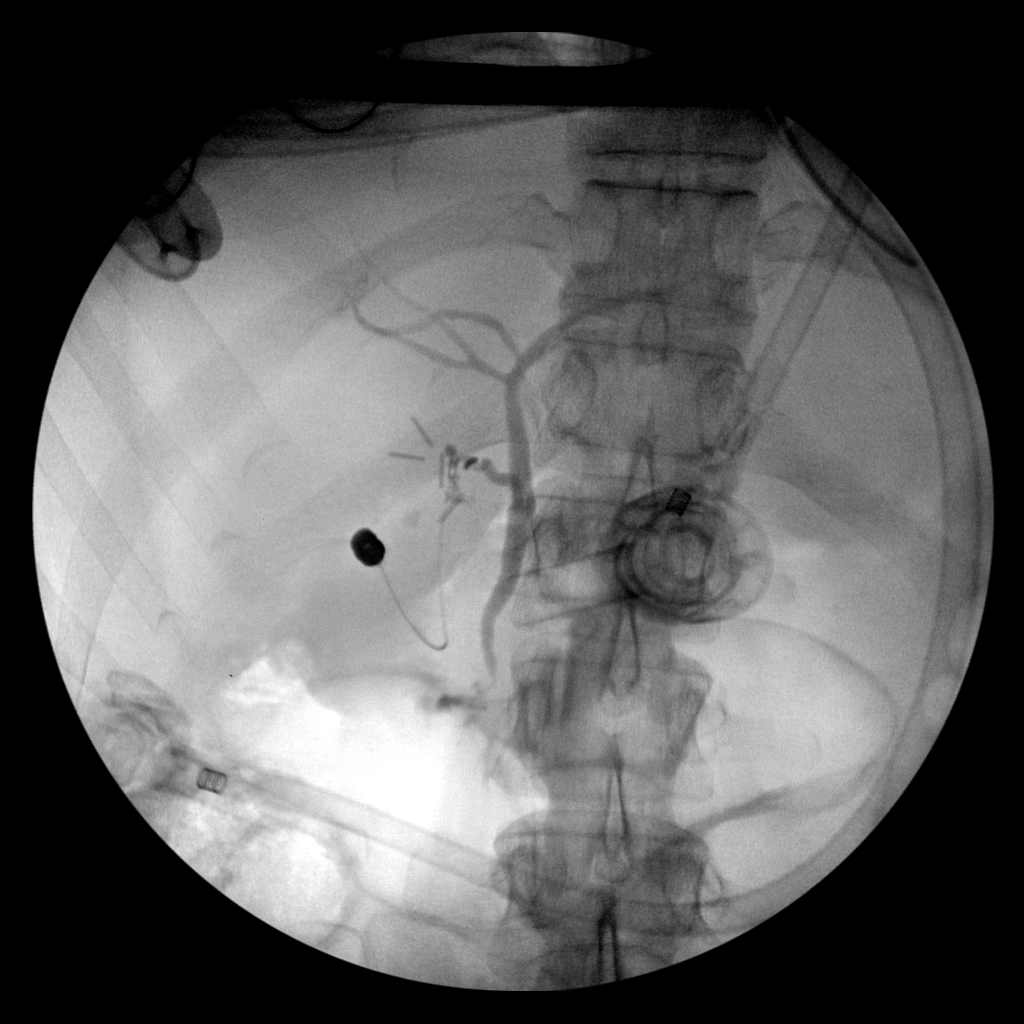
[frame 19/37]
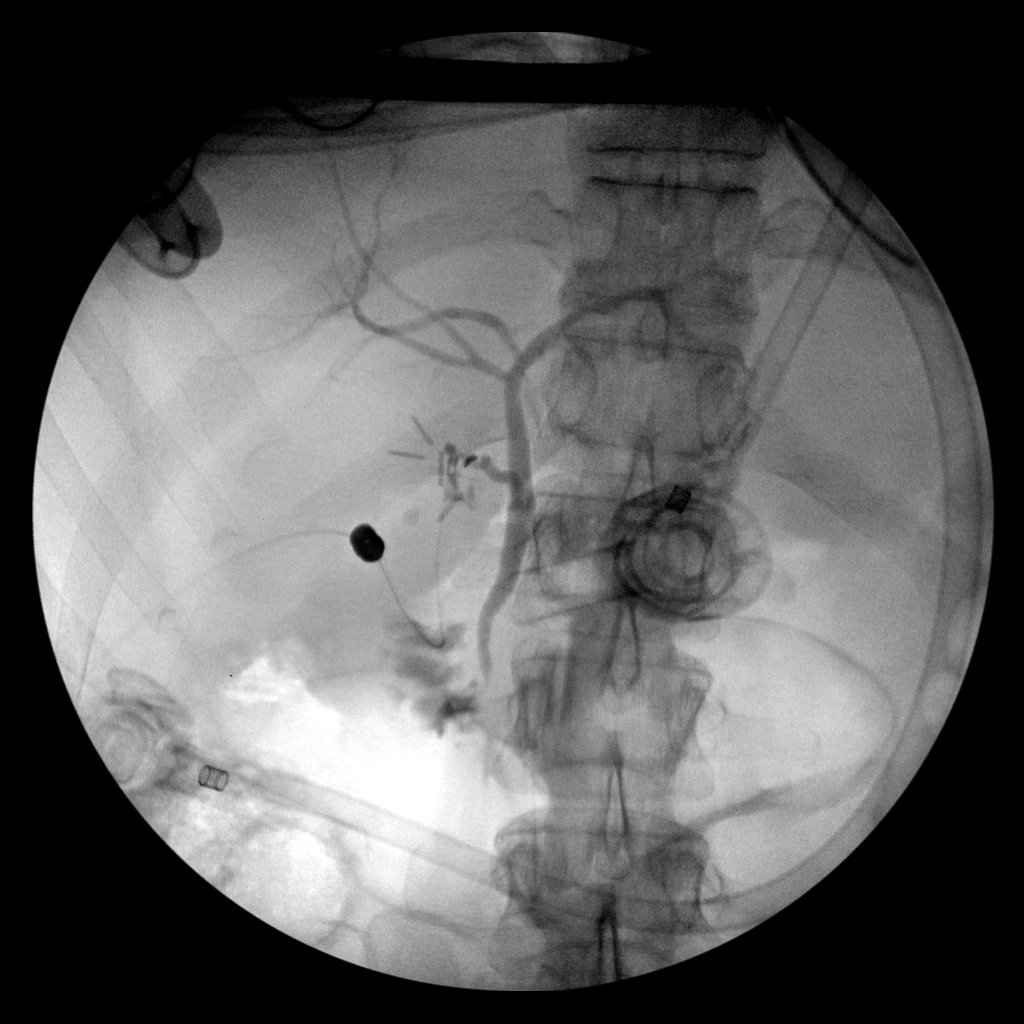
[frame 32/37]
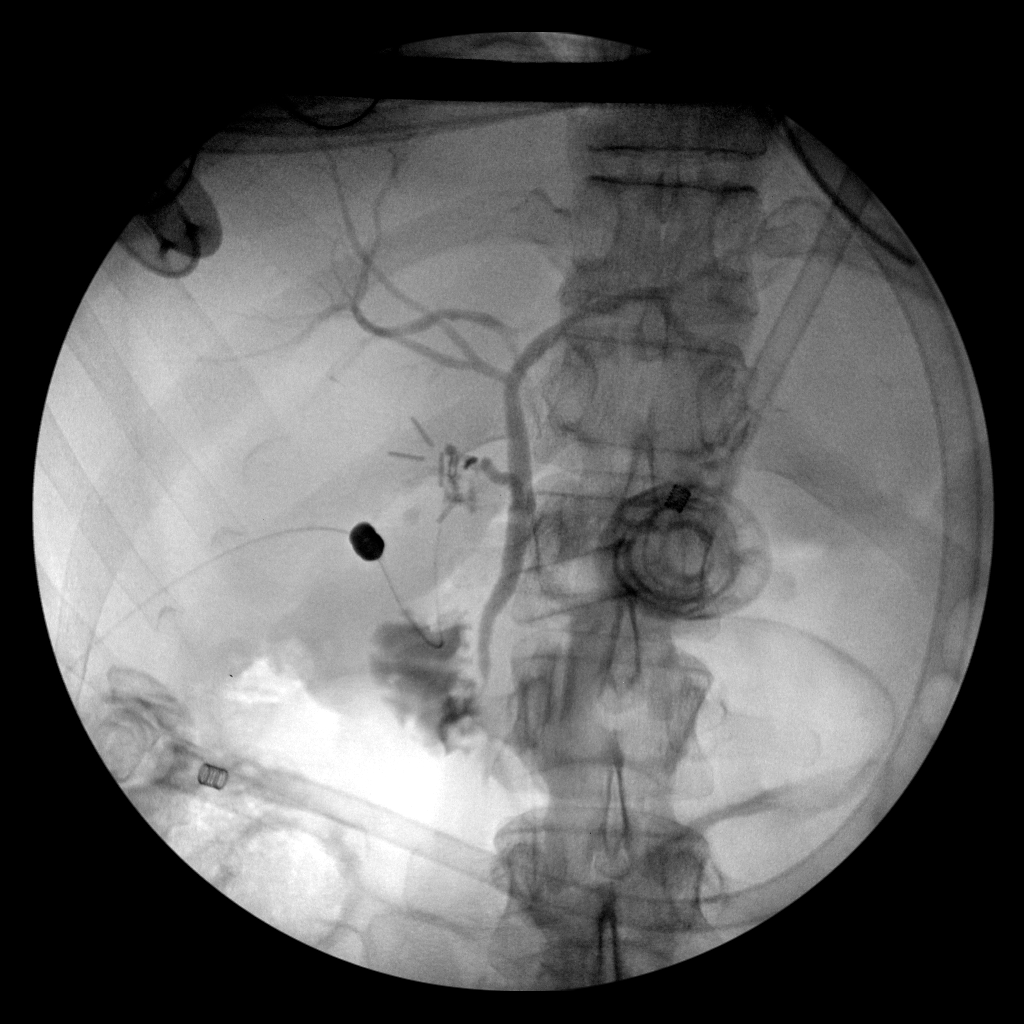
[frame 36/37]
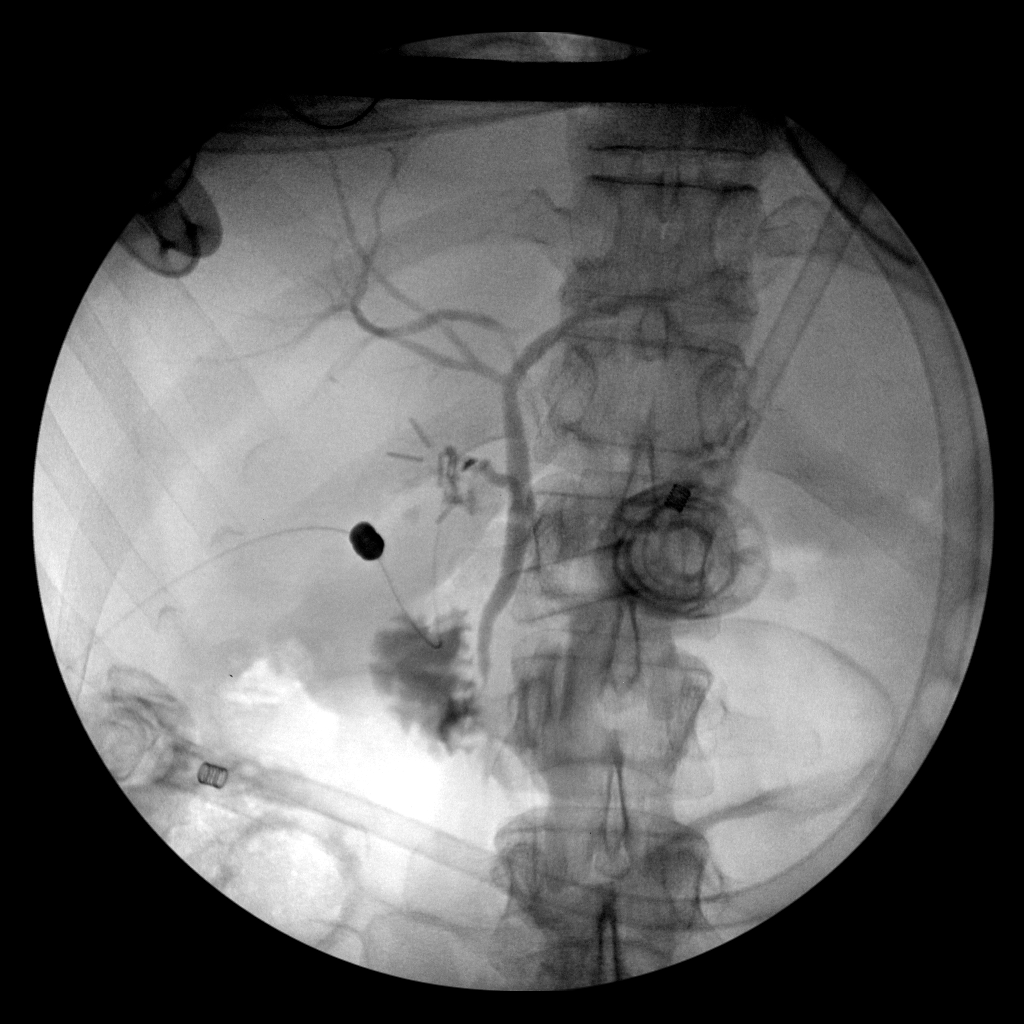

[4 of 4 positions shown; findings below may reference images not displayed]

FINDINGS: A single intraoperative cine clip is submitted for review. The
images demonstrate cannulation of the cystic duct remanent and
intraoperative cholangiogram. No evidence of biliary dilatation,
stenosis, stricture or choledocholithiasis. Variant biliary anatomy.
The right posterior ducts drain into the left hepatic duct.
IMPRESSION: Negative intraoperative cholangiogram.

## 2022-11-03 IMAGING — MG MM DIGITAL DIAGNOSTIC UNILAT*L* IMPLANT W/ TOMO W/ CAD
4 series · 4 of 12 positions shown · non-contrast
Comparison: Previous mammograms dating back to 2664

CLINICAL DATA: 54-year-old female for further evaluation of
possible INNER LEFT breast asymmetry on screening mammogram.

EXAM:
DIGITAL DIAGNOSTIC UNILATERAL LEFT MAMMOGRAM WITH IMPLANTS, CAD AND
TOMOSYNTHESIS
TECHNIQUE: Left digital diagnostic mammography and breast tomosynthesis was
performed. The images were evaluated with computer-aided detection.
Standard and/or implant displaced views were performed.

[L ML synth-2D]
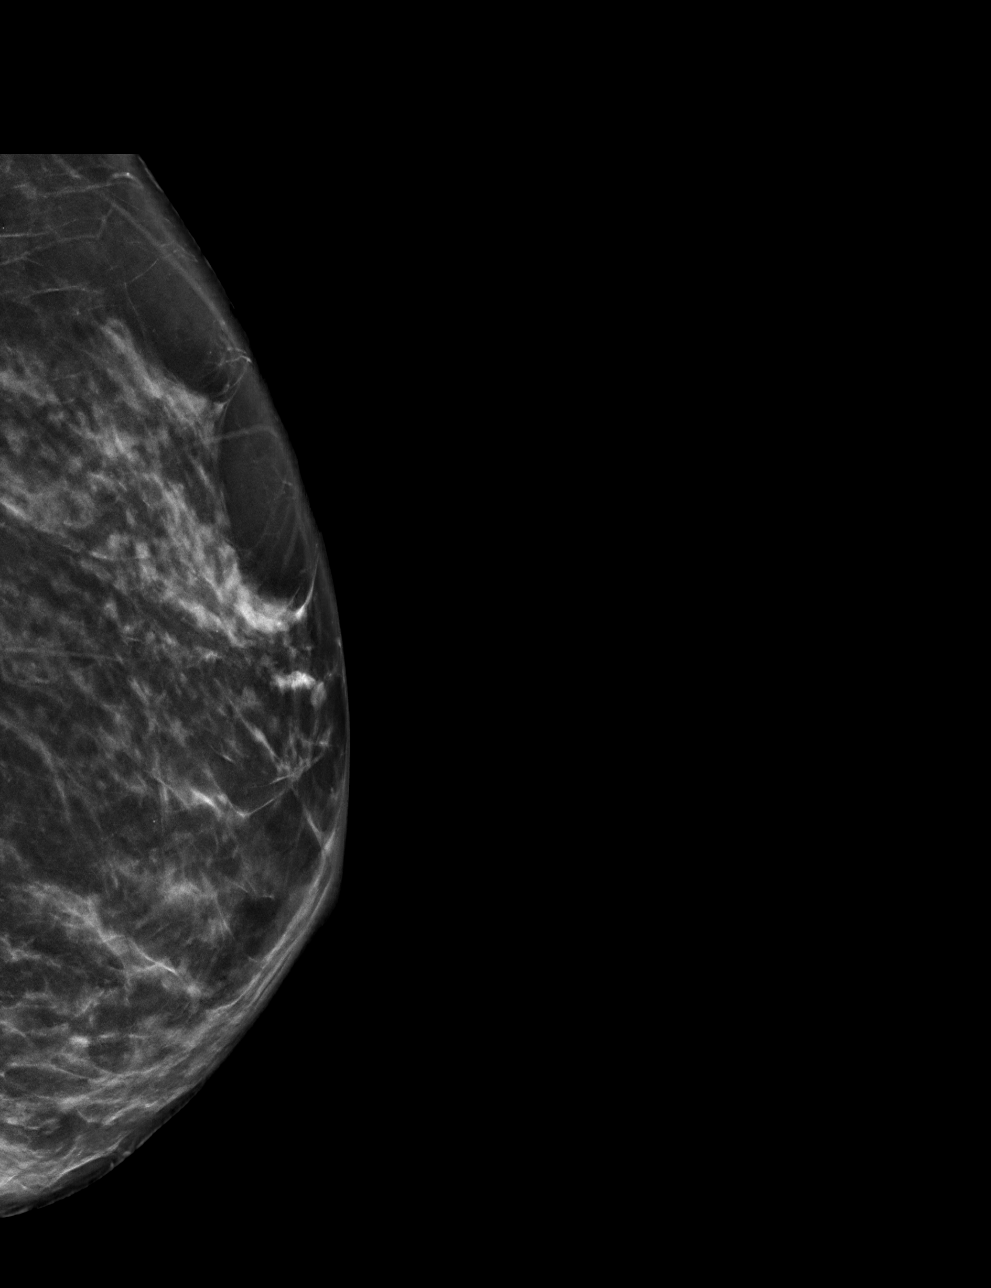

[L CC synth-2D]
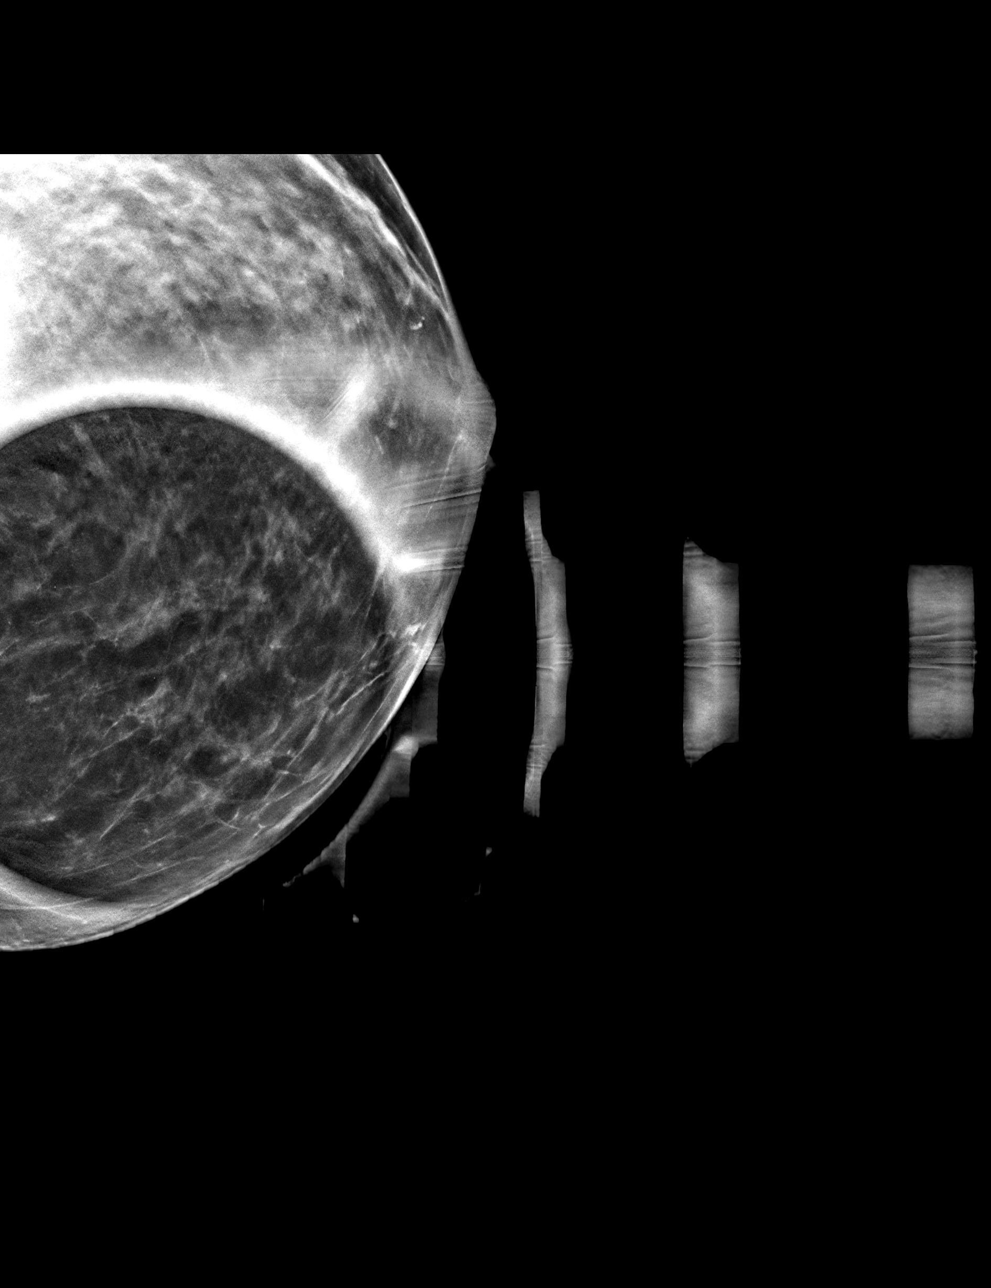

[L CCID BREAST TOMOSYNTHESIS IMAGE tomo · tomo slice 25/50.0]
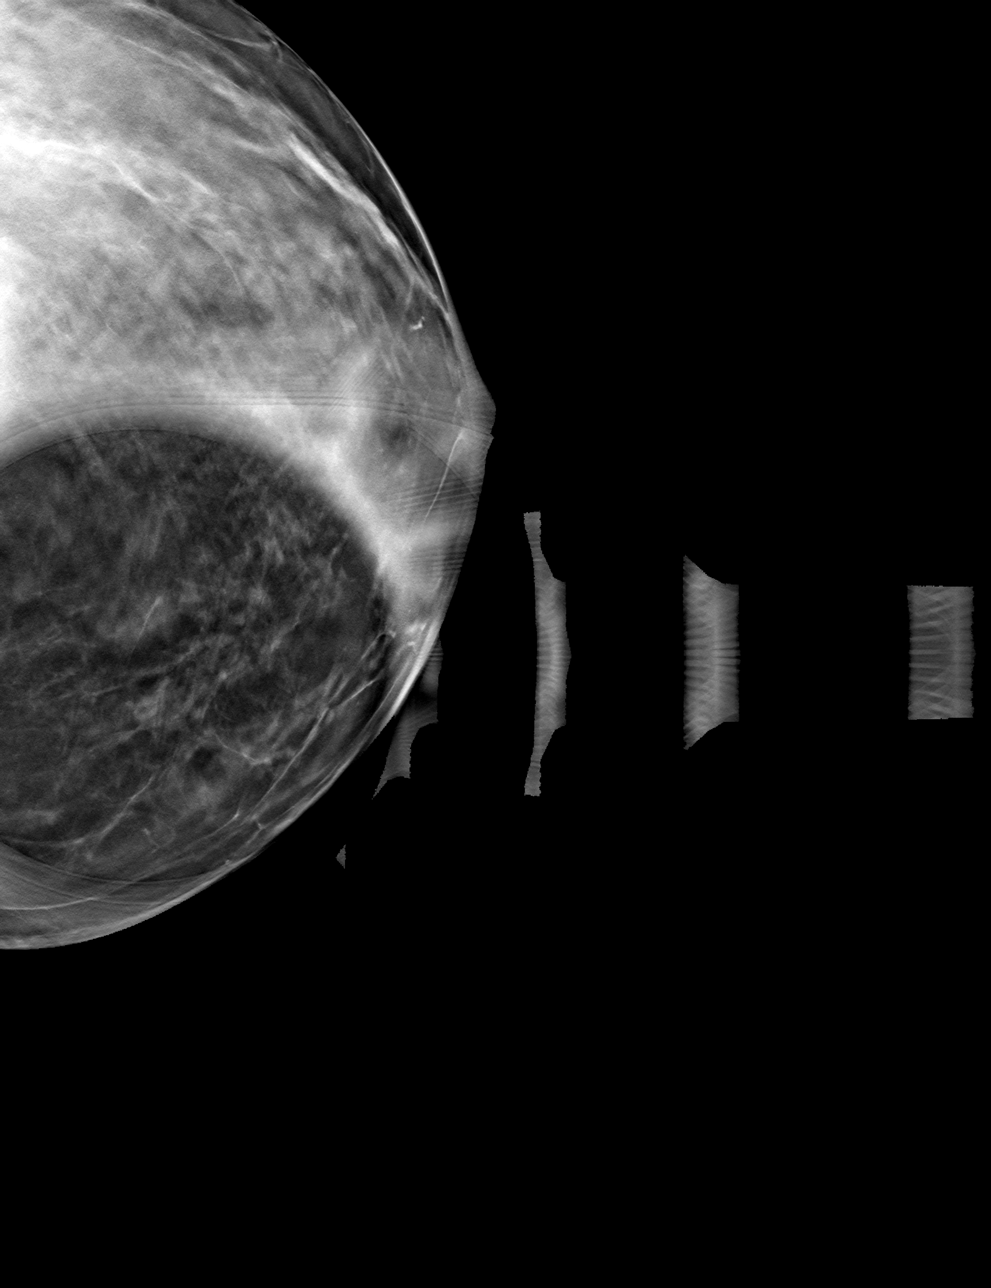

[L MLID BREAST TOMOSYNTHESIS IMAGE tomo · tomo slice 37/73.0]
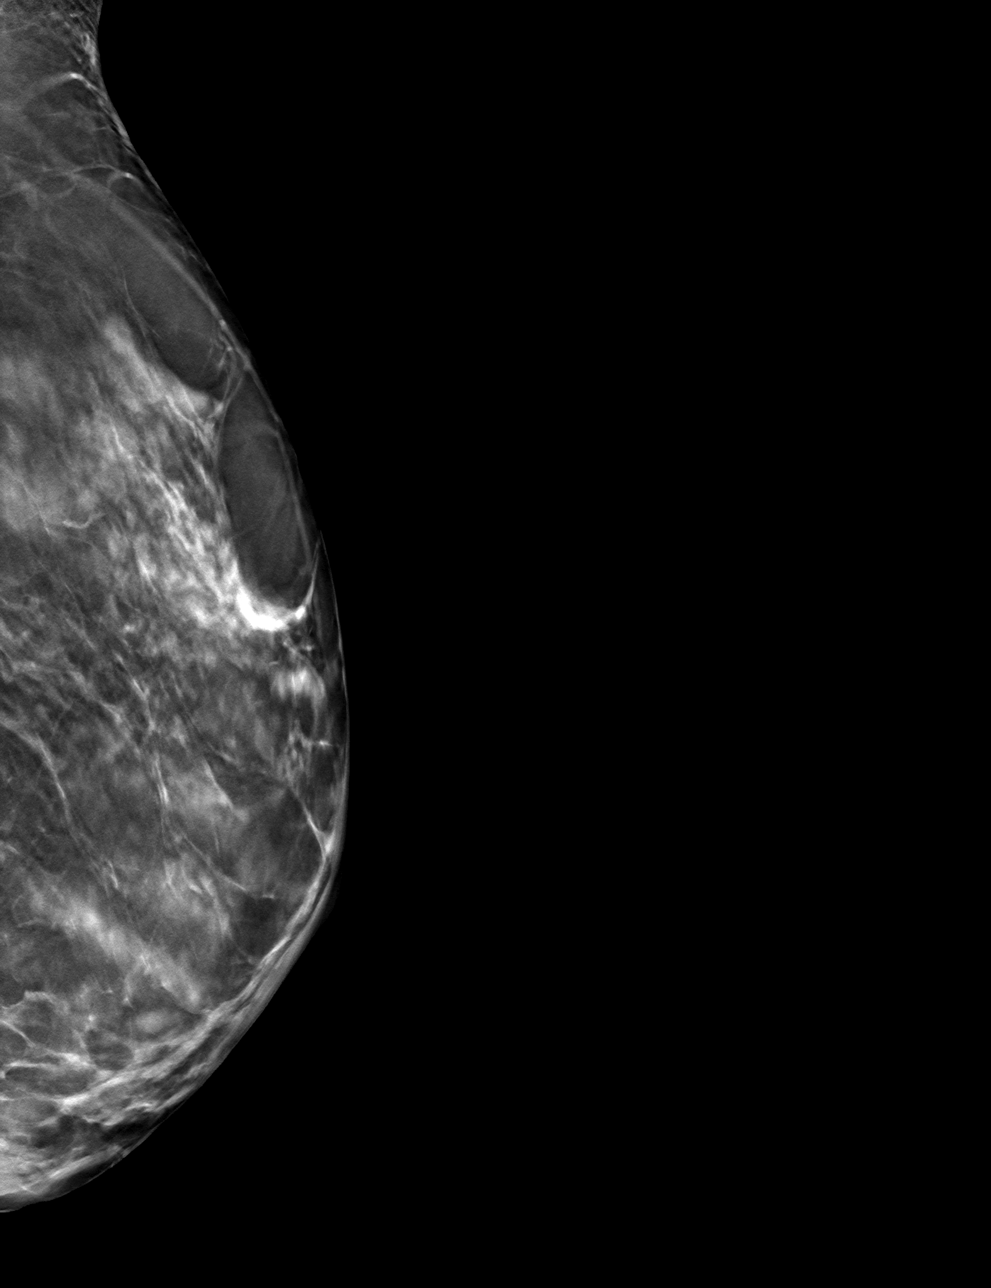

[4 of 12 positions shown; findings below may reference images not displayed]

ACR Breast Density Category b: There are scattered areas of
fibroglandular density.
FINDINGS: 2D/3D full field and spot compression views of the LEFT breast
demonstrate dispersal of the posterior INNER LEFT breast screening
study asymmetry. No persistent abnormality in this area is noted.
IMPRESSION: No persistent abnormality at the area of the screening study
finding.

RECOMMENDATION:
Bilateral screening mammogram in 1 year.

I have discussed the findings and recommendations with the patient.
If applicable, a reminder letter will be sent to the patient
regarding the next appointment.

BI-RADS CATEGORY  1: Negative.
